# Patient Record
Sex: Female | Born: 1935 | Race: White | Hispanic: No | Marital: Single | Smoking: Never smoker
Health system: Southern US, Community
[De-identification: ages and names within clinical notes are randomized; demographics above are authoritative.]

## PROBLEM LIST (undated history)

## (undated) DIAGNOSIS — C4491 Basal cell carcinoma of skin, unspecified: Secondary | ICD-10-CM

## (undated) DIAGNOSIS — C50919 Malignant neoplasm of unspecified site of unspecified female breast: Secondary | ICD-10-CM

## (undated) DIAGNOSIS — C801 Malignant (primary) neoplasm, unspecified: Secondary | ICD-10-CM

## (undated) DIAGNOSIS — I2510 Atherosclerotic heart disease of native coronary artery without angina pectoris: Secondary | ICD-10-CM

## (undated) DIAGNOSIS — K219 Gastro-esophageal reflux disease without esophagitis: Secondary | ICD-10-CM

## (undated) DIAGNOSIS — I10 Essential (primary) hypertension: Secondary | ICD-10-CM

## (undated) DIAGNOSIS — L570 Actinic keratosis: Secondary | ICD-10-CM

## (undated) DIAGNOSIS — Z923 Personal history of irradiation: Secondary | ICD-10-CM

## (undated) HISTORY — DX: Actinic keratosis: L57.0

## (undated) HISTORY — PX: ABDOMINAL HYSTERECTOMY: SHX81

## (undated) HISTORY — PX: OTHER SURGICAL HISTORY: SHX169

## (undated) HISTORY — DX: Basal cell carcinoma of skin, unspecified: C44.91

---

## 2003-01-13 ENCOUNTER — Other Ambulatory Visit: Payer: Self-pay

## 2004-06-27 ENCOUNTER — Ambulatory Visit: Payer: Self-pay | Admitting: Family Medicine

## 2005-08-23 ENCOUNTER — Ambulatory Visit: Payer: Self-pay | Admitting: Family Medicine

## 2006-08-23 ENCOUNTER — Ambulatory Visit: Payer: Self-pay | Admitting: Unknown Physician Specialty

## 2006-09-24 ENCOUNTER — Ambulatory Visit: Payer: Self-pay | Admitting: Family Medicine

## 2007-07-29 ENCOUNTER — Ambulatory Visit: Payer: Self-pay

## 2007-11-11 ENCOUNTER — Ambulatory Visit: Payer: Self-pay | Admitting: Family Medicine

## 2008-12-27 ENCOUNTER — Ambulatory Visit: Payer: Self-pay | Admitting: Family Medicine

## 2010-01-01 DIAGNOSIS — Z923 Personal history of irradiation: Secondary | ICD-10-CM

## 2010-01-01 HISTORY — PX: BREAST LUMPECTOMY: SHX2

## 2010-01-01 HISTORY — DX: Personal history of irradiation: Z92.3

## 2010-01-10 ENCOUNTER — Ambulatory Visit: Payer: Self-pay | Admitting: Family Medicine

## 2010-01-17 ENCOUNTER — Ambulatory Visit: Payer: Self-pay | Admitting: Family Medicine

## 2010-02-03 ENCOUNTER — Ambulatory Visit: Payer: Self-pay | Admitting: Surgery

## 2010-02-10 ENCOUNTER — Ambulatory Visit: Payer: Self-pay | Admitting: Surgery

## 2010-02-13 LAB — PATHOLOGY REPORT

## 2010-02-16 ENCOUNTER — Ambulatory Visit: Payer: Self-pay | Admitting: Internal Medicine

## 2010-02-22 ENCOUNTER — Ambulatory Visit: Payer: Self-pay | Admitting: Internal Medicine

## 2010-03-02 ENCOUNTER — Ambulatory Visit: Payer: Self-pay | Admitting: Internal Medicine

## 2010-03-09 ENCOUNTER — Ambulatory Visit: Payer: Self-pay | Admitting: Surgery

## 2010-04-02 ENCOUNTER — Ambulatory Visit: Payer: Self-pay | Admitting: Internal Medicine

## 2010-05-02 ENCOUNTER — Ambulatory Visit: Payer: Self-pay | Admitting: Internal Medicine

## 2010-06-02 ENCOUNTER — Ambulatory Visit: Payer: Self-pay | Admitting: Internal Medicine

## 2010-12-22 ENCOUNTER — Ambulatory Visit: Payer: Self-pay | Admitting: Internal Medicine

## 2011-01-17 ENCOUNTER — Ambulatory Visit: Payer: Self-pay | Admitting: Internal Medicine

## 2011-02-02 ENCOUNTER — Ambulatory Visit: Payer: Self-pay | Admitting: Internal Medicine

## 2011-02-08 ENCOUNTER — Ambulatory Visit: Payer: Self-pay | Admitting: Family Medicine

## 2011-08-09 ENCOUNTER — Ambulatory Visit: Payer: Self-pay | Admitting: Family Medicine

## 2011-08-29 ENCOUNTER — Ambulatory Visit: Payer: Self-pay | Admitting: Unknown Physician Specialty

## 2011-08-31 LAB — PATHOLOGY REPORT

## 2012-08-12 ENCOUNTER — Ambulatory Visit: Payer: Self-pay | Admitting: Family Medicine

## 2013-01-16 ENCOUNTER — Ambulatory Visit: Payer: Self-pay | Admitting: Radiation Oncology

## 2013-02-01 ENCOUNTER — Ambulatory Visit: Payer: Self-pay | Admitting: Radiation Oncology

## 2013-08-14 ENCOUNTER — Ambulatory Visit: Payer: Self-pay | Admitting: Family Medicine

## 2014-01-01 HISTORY — PX: BREAST BIOPSY: SHX20

## 2014-01-07 DIAGNOSIS — I10 Essential (primary) hypertension: Secondary | ICD-10-CM | POA: Diagnosis not present

## 2014-01-07 DIAGNOSIS — E785 Hyperlipidemia, unspecified: Secondary | ICD-10-CM | POA: Diagnosis not present

## 2014-01-15 ENCOUNTER — Ambulatory Visit: Payer: Self-pay | Admitting: Radiation Oncology

## 2014-04-13 DIAGNOSIS — L853 Xerosis cutis: Secondary | ICD-10-CM | POA: Diagnosis not present

## 2014-04-13 DIAGNOSIS — L821 Other seborrheic keratosis: Secondary | ICD-10-CM | POA: Diagnosis not present

## 2014-04-13 DIAGNOSIS — L578 Other skin changes due to chronic exposure to nonionizing radiation: Secondary | ICD-10-CM | POA: Diagnosis not present

## 2014-04-13 DIAGNOSIS — L570 Actinic keratosis: Secondary | ICD-10-CM | POA: Diagnosis not present

## 2014-04-13 DIAGNOSIS — Z85828 Personal history of other malignant neoplasm of skin: Secondary | ICD-10-CM | POA: Diagnosis not present

## 2014-06-29 DIAGNOSIS — L570 Actinic keratosis: Secondary | ICD-10-CM | POA: Diagnosis not present

## 2014-06-29 DIAGNOSIS — L578 Other skin changes due to chronic exposure to nonionizing radiation: Secondary | ICD-10-CM | POA: Diagnosis not present

## 2014-06-29 DIAGNOSIS — L219 Seborrheic dermatitis, unspecified: Secondary | ICD-10-CM | POA: Diagnosis not present

## 2014-07-29 DIAGNOSIS — S86011A Strain of right Achilles tendon, initial encounter: Secondary | ICD-10-CM | POA: Diagnosis not present

## 2014-08-02 ENCOUNTER — Other Ambulatory Visit: Payer: Self-pay | Admitting: Family Medicine

## 2014-08-02 DIAGNOSIS — Z1231 Encounter for screening mammogram for malignant neoplasm of breast: Secondary | ICD-10-CM

## 2014-08-02 DIAGNOSIS — C50911 Malignant neoplasm of unspecified site of right female breast: Secondary | ICD-10-CM

## 2014-08-16 ENCOUNTER — Ambulatory Visit
Admission: RE | Admit: 2014-08-16 | Discharge: 2014-08-16 | Disposition: A | Discharge status: Home / Self Care | Payer: Commercial Managed Care - HMO | Source: Ambulatory Visit | Attending: Family Medicine | Admitting: Family Medicine

## 2014-08-16 ENCOUNTER — Ambulatory Visit: Payer: Self-pay

## 2014-08-16 DIAGNOSIS — Z853 Personal history of malignant neoplasm of breast: Secondary | ICD-10-CM | POA: Diagnosis not present

## 2014-08-16 DIAGNOSIS — Z1231 Encounter for screening mammogram for malignant neoplasm of breast: Secondary | ICD-10-CM

## 2014-08-16 DIAGNOSIS — R921 Mammographic calcification found on diagnostic imaging of breast: Secondary | ICD-10-CM | POA: Insufficient documentation

## 2014-08-16 DIAGNOSIS — C50911 Malignant neoplasm of unspecified site of right female breast: Secondary | ICD-10-CM

## 2014-08-16 HISTORY — DX: Malignant (primary) neoplasm, unspecified: C80.1

## 2014-08-17 ENCOUNTER — Other Ambulatory Visit: Payer: Self-pay | Admitting: Family Medicine

## 2014-08-17 DIAGNOSIS — R921 Mammographic calcification found on diagnostic imaging of breast: Secondary | ICD-10-CM

## 2014-08-17 DIAGNOSIS — R928 Other abnormal and inconclusive findings on diagnostic imaging of breast: Secondary | ICD-10-CM

## 2014-08-25 ENCOUNTER — Ambulatory Visit
Admission: RE | Admit: 2014-08-25 | Discharge: 2014-08-25 | Disposition: A | Discharge status: Home / Self Care | Payer: Commercial Managed Care - HMO | Source: Ambulatory Visit | Attending: Family Medicine | Admitting: Family Medicine

## 2014-08-25 DIAGNOSIS — R920 Mammographic microcalcification found on diagnostic imaging of breast: Secondary | ICD-10-CM | POA: Diagnosis not present

## 2014-08-25 DIAGNOSIS — R928 Other abnormal and inconclusive findings on diagnostic imaging of breast: Secondary | ICD-10-CM

## 2014-08-25 DIAGNOSIS — R921 Mammographic calcification found on diagnostic imaging of breast: Secondary | ICD-10-CM | POA: Diagnosis not present

## 2014-08-25 DIAGNOSIS — N6011 Diffuse cystic mastopathy of right breast: Secondary | ICD-10-CM | POA: Diagnosis not present

## 2014-08-26 LAB — SURGICAL PATHOLOGY

## 2014-09-07 DIAGNOSIS — Z1211 Encounter for screening for malignant neoplasm of colon: Secondary | ICD-10-CM | POA: Diagnosis not present

## 2014-09-07 DIAGNOSIS — Z86010 Personal history of colonic polyps: Secondary | ICD-10-CM | POA: Diagnosis not present

## 2014-10-08 ENCOUNTER — Encounter: Payer: Self-pay | Admitting: *Deleted

## 2014-10-11 ENCOUNTER — Ambulatory Visit: Payer: Commercial Managed Care - HMO | Admitting: Anesthesiology

## 2014-10-11 ENCOUNTER — Encounter
Admission: RE | Disposition: A | Discharge status: Home / Self Care | Payer: Self-pay | Source: Ambulatory Visit | Attending: Unknown Physician Specialty

## 2014-10-11 ENCOUNTER — Encounter: Payer: Self-pay | Admitting: *Deleted

## 2014-10-11 ENCOUNTER — Ambulatory Visit
Admission: RE | Admit: 2014-10-11 | Discharge: 2014-10-11 | Disposition: A | Discharge status: Home / Self Care | Payer: Commercial Managed Care - HMO | Source: Ambulatory Visit | Attending: Unknown Physician Specialty | Admitting: Unknown Physician Specialty

## 2014-10-11 DIAGNOSIS — I10 Essential (primary) hypertension: Secondary | ICD-10-CM | POA: Diagnosis not present

## 2014-10-11 DIAGNOSIS — Z90710 Acquired absence of both cervix and uterus: Secondary | ICD-10-CM | POA: Insufficient documentation

## 2014-10-11 DIAGNOSIS — K621 Rectal polyp: Secondary | ICD-10-CM | POA: Insufficient documentation

## 2014-10-11 DIAGNOSIS — Z923 Personal history of irradiation: Secondary | ICD-10-CM | POA: Insufficient documentation

## 2014-10-11 DIAGNOSIS — Z853 Personal history of malignant neoplasm of breast: Secondary | ICD-10-CM | POA: Diagnosis not present

## 2014-10-11 DIAGNOSIS — K5790 Diverticulosis of intestine, part unspecified, without perforation or abscess without bleeding: Secondary | ICD-10-CM | POA: Diagnosis not present

## 2014-10-11 DIAGNOSIS — D120 Benign neoplasm of cecum: Secondary | ICD-10-CM | POA: Diagnosis not present

## 2014-10-11 DIAGNOSIS — D124 Benign neoplasm of descending colon: Secondary | ICD-10-CM | POA: Diagnosis not present

## 2014-10-11 DIAGNOSIS — K635 Polyp of colon: Secondary | ICD-10-CM | POA: Diagnosis not present

## 2014-10-11 DIAGNOSIS — Z79899 Other long term (current) drug therapy: Secondary | ICD-10-CM | POA: Diagnosis not present

## 2014-10-11 DIAGNOSIS — D128 Benign neoplasm of rectum: Secondary | ICD-10-CM | POA: Diagnosis not present

## 2014-10-11 DIAGNOSIS — K5730 Diverticulosis of large intestine without perforation or abscess without bleeding: Secondary | ICD-10-CM | POA: Diagnosis not present

## 2014-10-11 DIAGNOSIS — Z85828 Personal history of other malignant neoplasm of skin: Secondary | ICD-10-CM | POA: Insufficient documentation

## 2014-10-11 DIAGNOSIS — Z86010 Personal history of colonic polyps: Secondary | ICD-10-CM | POA: Insufficient documentation

## 2014-10-11 DIAGNOSIS — D122 Benign neoplasm of ascending colon: Secondary | ICD-10-CM | POA: Insufficient documentation

## 2014-10-11 DIAGNOSIS — Z803 Family history of malignant neoplasm of breast: Secondary | ICD-10-CM | POA: Diagnosis not present

## 2014-10-11 DIAGNOSIS — K219 Gastro-esophageal reflux disease without esophagitis: Secondary | ICD-10-CM | POA: Diagnosis not present

## 2014-10-11 DIAGNOSIS — I2510 Atherosclerotic heart disease of native coronary artery without angina pectoris: Secondary | ICD-10-CM | POA: Diagnosis not present

## 2014-10-11 HISTORY — DX: Atherosclerotic heart disease of native coronary artery without angina pectoris: I25.10

## 2014-10-11 HISTORY — PX: COLONOSCOPY WITH PROPOFOL: SHX5780

## 2014-10-11 HISTORY — DX: Gastro-esophageal reflux disease without esophagitis: K21.9

## 2014-10-11 HISTORY — DX: Essential (primary) hypertension: I10

## 2014-10-11 SURGERY — COLONOSCOPY WITH PROPOFOL
Anesthesia: General

## 2014-10-11 MED ORDER — EPHEDRINE SULFATE 50 MG/ML IJ SOLN
INTRAMUSCULAR | Status: DC | PRN
  Administered 2014-10-11: 10 mg via INTRAVENOUS
  Administered 2014-10-11: 5 mg via INTRAVENOUS
  Administered 2014-10-11: 10 mg via INTRAVENOUS

## 2014-10-11 MED ORDER — FENTANYL CITRATE (PF) 100 MCG/2ML IJ SOLN
INTRAMUSCULAR | Status: DC | PRN
  Administered 2014-10-11: 50 ug via INTRAVENOUS

## 2014-10-11 MED ORDER — PROPOFOL 10 MG/ML IV BOLUS
INTRAVENOUS | Status: DC | PRN
  Administered 2014-10-11: 20 mg via INTRAVENOUS
  Administered 2014-10-11: 30 mg via INTRAVENOUS

## 2014-10-11 MED ORDER — SODIUM CHLORIDE 0.9 % IV SOLN
INTRAVENOUS | Status: DC
  Administered 2014-10-11: 10:00:00 via INTRAVENOUS

## 2014-10-11 MED ORDER — PHENYLEPHRINE HCL 10 MG/ML IJ SOLN
INTRAMUSCULAR | Status: DC | PRN
  Administered 2014-10-11: 200 ug via INTRAVENOUS
  Administered 2014-10-11: 100 ug via INTRAVENOUS

## 2014-10-11 MED ORDER — PROPOFOL 500 MG/50ML IV EMUL
INTRAVENOUS | Status: DC | PRN
  Administered 2014-10-11: 75 ug/kg/min via INTRAVENOUS

## 2014-10-11 MED ORDER — SODIUM CHLORIDE 0.9 % IV SOLN: INTRAVENOUS | Status: DC

## 2014-10-11 MED ORDER — LIDOCAINE HCL (PF) 2 % IJ SOLN
INTRAMUSCULAR | Status: DC | PRN
  Administered 2014-10-11: 50 mg

## 2014-10-11 NOTE — Transfer of Care (Signed)
Immediate Anesthesia Transfer of Care Note  Patient: Robin Roberts  Procedure(s) Performed: Procedure(s): COLONOSCOPY WITH PROPOFOL (N/A)  Patient Location: PACU  Anesthesia Type:General  Level of Consciousness: sedated  Airway & Oxygen Therapy: Patient Spontanous Breathing and Patient connected to nasal cannula oxygen  Post-op Assessment: Report given to RN and Post -op Vital signs reviewed and stable  Post vital signs: Reviewed and stable  Last Vitals:  Filed Vitals:   10/11/14 1040  BP: 69/41  Pulse: 88  Temp: 35.8 C  Resp: 17    Complications: No apparent anesthesia complications

## 2014-10-11 NOTE — Anesthesia Preprocedure Evaluation (Signed)
Anesthesia Evaluation  Patient identified by MRN, date of birth, ID band Patient awake    Reviewed: Allergy & Precautions, H&P , NPO status , Patient's Chart, lab work & pertinent test results  History of Anesthesia Complications Negative for: history of anesthetic complications  Airway Mallampati: II  TM Distance: >3 FB Neck ROM: limited    Dental  (+) Teeth Intact   Pulmonary neg pulmonary ROS, neg shortness of breath,    Pulmonary exam normal breath sounds clear to auscultation       Cardiovascular Exercise Tolerance: Good hypertension, (-) angina+ CAD  (-) Past MI Normal cardiovascular exam Rhythm:regular Rate:Normal     Neuro/Psych negative neurological ROS  negative psych ROS   GI/Hepatic Neg liver ROS, GERD  Controlled,  Endo/Other  negative endocrine ROS  Renal/GU negative Renal ROS  negative genitourinary   Musculoskeletal   Abdominal   Peds  Hematology negative hematology ROS (+)   Anesthesia Other Findings Past Medical History:   Cancer ([place])                                                   Comment:skin   Cancer ([place])                                    right          Comment:breast cancer 2012   GERD (gastroesophageal reflux disease)                       Coronary artery disease                                      Hypertension                                                BMI    Body Mass Index   26.95 kg/m 2      Reproductive/Obstetrics negative OB ROS                             Anesthesia Physical Anesthesia Plan  ASA: III  Anesthesia Plan: General   Post-op Pain Management:    Induction:   Airway Management Planned:   Additional Equipment:   Intra-op Plan:   Post-operative Plan:   Informed Consent: I have reviewed the patients History and Physical, chart, labs and discussed the procedure including the risks, benefits and alternatives for the  proposed anesthesia with the patient or authorized representative who has indicated his/her understanding and acceptance.   Dental Advisory Given  Plan Discussed with: Anesthesiologist, CRNA and Surgeon  Anesthesia Plan Comments:         Anesthesia Quick Evaluation

## 2014-10-11 NOTE — Anesthesia Postprocedure Evaluation (Signed)
  Anesthesia Post-op Note  Patient: Robin Roberts  Procedure(s) Performed: Procedure(s): COLONOSCOPY WITH PROPOFOL (N/A)  Anesthesia type:General  Patient location: PACU  Post pain: Pain level controlled  Post assessment: Post-op Vital signs reviewed, Patient's Cardiovascular Status Stable, Respiratory Function Stable, Patent Airway and No signs of Nausea or vomiting  Post vital signs: Reviewed and stable  Last Vitals:  Filed Vitals:   10/11/14 1100  BP: 108/53  Pulse: 82  Temp:   Resp: 12    Level of consciousness: awake, alert  and patient cooperative  Complications: No apparent anesthesia complications

## 2014-10-11 NOTE — H&P (Signed)
   Primary Care Physician:  Maryland Pink, MD Primary Gastroenterologist:  Dr. Vira Agar  Pre-Procedure History & Physical: HPI:  Robin Roberts is a 81 y.o. female is here for an colonoscopy.   Past Medical History  Diagnosis Date  . Cancer ([place])     skin  . Cancer [facility]) right    breast cancer 2012  . GERD (gastroesophageal reflux disease)   . Coronary artery disease   . Hypertension     Past Surgical History  Procedure Laterality Date  . Breast excisional biopsy Right     2012 positive  . Abdominal hysterectomy    . Post status radiation therapy Right     breast cancer  . Mastectomy      Prior to Admission medications   Medication Sig Start Date End Date Taking? Authorizing Provider  amlodipine-atorvastatin (CADUET) 10-20 MG tablet Take 1 tablet by mouth daily.   Yes Historical Provider, MD  benazepril (LOTENSIN) 20 MG tablet Take 20 mg by mouth daily.   Yes Historical Provider, MD  fluticasone (FLONASE) 50 MCG/ACT nasal spray Place 1 spray into both nostrils daily.   Yes Historical Provider, MD  loratadine (CLARITIN) 10 MG tablet Take 10 mg by mouth daily as needed for allergies.   Yes Historical Provider, MD  ranitidine (ZANTAC) 150 MG tablet Take 150 mg by mouth 2 (two) times daily.   Yes Historical Provider, MD  triamterene-hydrochlorothiazide (MAXZIDE-25) 37.5-25 MG tablet Take 1 tablet by mouth daily.   Yes Historical Provider, MD  amLODipine-benazepril (LOTREL) 10-20 MG capsule Take 1 capsule by mouth daily.    Historical Provider, MD  hydrochlorothiazide (MICROZIDE) 12.5 MG capsule Take 12.5 mg by mouth daily.    Historical Provider, MD    Allergies as of 09/08/2014  . (Not on File)    Family History  Problem Relation Age of Onset  . Breast cancer Neg Hx     Social History   Social History  . Marital Status: Single    Spouse Name: N/A  . Number of Children: N/A  . Years of Education: N/A   Occupational History  . Not on file.   Social History Main  Topics  . Smoking status: Never Smoker   . Smokeless tobacco: Not on file  . Alcohol Use: No  . Drug Use: No  . Sexual Activity: Not on file   Other Topics Concern  . Not on file   Social History Narrative    Review of Systems: See HPI, otherwise negative ROS  Physical Exam: BP 132/66 mmHg  Pulse 105  Temp(Src) 97.7 F (36.5 C) (Tympanic)  Resp 12  Ht 5\' 5"  (1.651 m)  Wt 73.483 kg (162 lb)  BMI 26.96 kg/m2  SpO2 98% General:   Alert,  pleasant and cooperative in NAD Head:  Normocephalic and atraumatic. Neck:  Supple; no masses or thyromegaly. Lungs:  Clear throughout to auscultation.    Heart:  Regular rate and rhythm. Abdomen:  Soft, nontender and nondistended. Normal bowel sounds, without guarding, and without rebound.   Neurologic:  Alert and  oriented x4;  grossly normal neurologically.  Impression/Plan: Robin Roberts is here for an colonoscopy to be performed for [facility] colon polyps  Risks, benefits, limitations, and alternatives regarding  colonoscopy have been reviewed with the patient.  Questions have been answered.  All parties agreeable.   Gaylyn Cheers, MD  10/11/2014, 10:07 AM

## 2014-10-11 NOTE — Op Note (Signed)
[facility] Gastroenterology Patient Name: Robin Roberts Procedure Date: 10/11/2014 10:07 AM MRN: [id number] Account #: [id number] Date of Birth: Aug 09, 1933 Admit Type: Outpatient Age: 81 Room: [facility] ENDO ROOM 1 Gender: Female Note Status: Finalized Procedure:         Colonoscopy Indications:       Personal history of colonic polyps Providers:         Manya Silvas, MD Referring MD:      Irven Easterly. Kary Kos, MD (Referring MD) Medicines:         Propofol per Anesthesia Complications:     No immediate complications. Procedure:         Pre-Anesthesia Assessment:                    - After reviewing the risks and benefits, the patient was                     deemed in satisfactory condition to undergo the procedure.                    After obtaining informed consent, the colonoscope was                     passed under direct vision. Throughout the procedure, the                     patient's blood pressure, pulse, and oxygen saturations                     were monitored continuously. The Colonoscope was                     introduced through the anus and advanced to the the cecum,                     identified by appendiceal orifice and ileocecal valve. Findings:      Multiple small-mouthed diverticula were found in the sigmoid colon, in       the descending colon and in the transverse colon.      Three sessile polyps were found in the rectum. The polyps were       diminutive in size. These polyps were removed with a cold biopsy       forceps. Resection and retrieval were complete.      A diminutive polyp was found in the ascending colon. The polyp was       sessile. The polyp was removed with a cold biopsy forceps. Resection and       retrieval were complete.      A diminutive polyp was found in the cecum. The polyp was sessile. The       polyp was removed with a cold biopsy forceps. Resection and retrieval       were complete.      The exam was otherwise  without abnormality. Impression:        - Diverticulosis in the sigmoid colon, in the descending                     colon and in the transverse colon.                    - Three diminutive polyps in the rectum. Resected and  retrieved.                    - One diminutive polyp in the ascending colon. Resected                     and retrieved.                    - One diminutive polyp in the cecum. Resected and                     retrieved.                    - The examination was otherwise normal. Recommendation:    - Await pathology results. Manya Silvas, MD 10/11/2014 10:32:33 AM This report has been signed electronically. Number of Addenda: 0 Note Initiated On: 10/11/2014 10:07 AM Scope Withdrawal Time: 0 hours 10 minutes 24 seconds  Total Procedure Duration: 0 hours 15 minutes 19 seconds       [facility]

## 2014-10-13 LAB — SURGICAL PATHOLOGY

## 2014-10-19 DIAGNOSIS — L578 Other skin changes due to chronic exposure to nonionizing radiation: Secondary | ICD-10-CM | POA: Diagnosis not present

## 2014-10-19 DIAGNOSIS — Z85828 Personal history of other malignant neoplasm of skin: Secondary | ICD-10-CM | POA: Diagnosis not present

## 2014-10-19 DIAGNOSIS — L570 Actinic keratosis: Secondary | ICD-10-CM | POA: Diagnosis not present

## 2014-10-20 ENCOUNTER — Encounter: Payer: Self-pay | Admitting: Unknown Physician Specialty

## 2015-01-05 DIAGNOSIS — L570 Actinic keratosis: Secondary | ICD-10-CM | POA: Diagnosis not present

## 2015-01-13 DIAGNOSIS — Z0000 Encounter for general adult medical examination without abnormal findings: Secondary | ICD-10-CM | POA: Diagnosis not present

## 2015-01-13 DIAGNOSIS — Z79899 Other long term (current) drug therapy: Secondary | ICD-10-CM | POA: Diagnosis not present

## 2015-01-20 DIAGNOSIS — I10 Essential (primary) hypertension: Secondary | ICD-10-CM | POA: Diagnosis not present

## 2015-01-20 DIAGNOSIS — Z0000 Encounter for general adult medical examination without abnormal findings: Secondary | ICD-10-CM | POA: Diagnosis not present

## 2015-01-20 DIAGNOSIS — E785 Hyperlipidemia, unspecified: Secondary | ICD-10-CM | POA: Diagnosis not present

## 2015-01-20 DIAGNOSIS — N289 Disorder of kidney and ureter, unspecified: Secondary | ICD-10-CM | POA: Diagnosis not present

## 2015-02-07 DIAGNOSIS — L570 Actinic keratosis: Secondary | ICD-10-CM | POA: Diagnosis not present

## 2015-02-15 DIAGNOSIS — D485 Neoplasm of uncertain behavior of skin: Secondary | ICD-10-CM | POA: Diagnosis not present

## 2015-02-15 DIAGNOSIS — L570 Actinic keratosis: Secondary | ICD-10-CM | POA: Diagnosis not present

## 2015-02-15 DIAGNOSIS — L820 Inflamed seborrheic keratosis: Secondary | ICD-10-CM | POA: Diagnosis not present

## 2015-02-15 DIAGNOSIS — L578 Other skin changes due to chronic exposure to nonionizing radiation: Secondary | ICD-10-CM | POA: Diagnosis not present

## 2015-02-16 DIAGNOSIS — N289 Disorder of kidney and ureter, unspecified: Secondary | ICD-10-CM | POA: Diagnosis not present

## 2015-03-01 DIAGNOSIS — H35361 Drusen (degenerative) of macula, right eye: Secondary | ICD-10-CM | POA: Diagnosis not present

## 2015-04-19 DIAGNOSIS — L853 Xerosis cutis: Secondary | ICD-10-CM | POA: Diagnosis not present

## 2015-04-19 DIAGNOSIS — L570 Actinic keratosis: Secondary | ICD-10-CM | POA: Diagnosis not present

## 2015-04-19 DIAGNOSIS — L578 Other skin changes due to chronic exposure to nonionizing radiation: Secondary | ICD-10-CM | POA: Diagnosis not present

## 2015-07-14 DIAGNOSIS — S80262A Insect bite (nonvenomous), left knee, initial encounter: Secondary | ICD-10-CM | POA: Diagnosis not present

## 2015-07-14 DIAGNOSIS — R229 Localized swelling, mass and lump, unspecified: Secondary | ICD-10-CM | POA: Diagnosis not present

## 2015-07-14 DIAGNOSIS — W57XXXA Bitten or stung by nonvenomous insect and other nonvenomous arthropods, initial encounter: Secondary | ICD-10-CM | POA: Diagnosis not present

## 2015-08-18 DIAGNOSIS — Z79899 Other long term (current) drug therapy: Secondary | ICD-10-CM | POA: Diagnosis not present

## 2015-08-19 ENCOUNTER — Other Ambulatory Visit: Payer: Self-pay | Admitting: Family Medicine

## 2015-08-19 DIAGNOSIS — Z853 Personal history of malignant neoplasm of breast: Secondary | ICD-10-CM

## 2015-09-19 DIAGNOSIS — R7989 Other specified abnormal findings of blood chemistry: Secondary | ICD-10-CM | POA: Diagnosis not present

## 2015-09-22 ENCOUNTER — Ambulatory Visit
Admission: RE | Admit: 2015-09-22 | Discharge: 2015-09-22 | Disposition: A | Discharge status: Home / Self Care | Payer: Commercial Managed Care - HMO | Source: Ambulatory Visit | Attending: Family Medicine | Admitting: Family Medicine

## 2015-09-22 DIAGNOSIS — Z9889 Other specified postprocedural states: Secondary | ICD-10-CM | POA: Diagnosis not present

## 2015-09-22 DIAGNOSIS — Z853 Personal history of malignant neoplasm of breast: Secondary | ICD-10-CM | POA: Diagnosis not present

## 2015-09-22 DIAGNOSIS — R928 Other abnormal and inconclusive findings on diagnostic imaging of breast: Secondary | ICD-10-CM | POA: Diagnosis not present

## 2015-09-22 DIAGNOSIS — R921 Mammographic calcification found on diagnostic imaging of breast: Secondary | ICD-10-CM | POA: Diagnosis not present

## 2015-12-12 DIAGNOSIS — L570 Actinic keratosis: Secondary | ICD-10-CM | POA: Diagnosis not present

## 2015-12-12 DIAGNOSIS — L820 Inflamed seborrheic keratosis: Secondary | ICD-10-CM | POA: Diagnosis not present

## 2015-12-12 DIAGNOSIS — L578 Other skin changes due to chronic exposure to nonionizing radiation: Secondary | ICD-10-CM | POA: Diagnosis not present

## 2015-12-12 DIAGNOSIS — Z85828 Personal history of other malignant neoplasm of skin: Secondary | ICD-10-CM | POA: Diagnosis not present

## 2016-01-16 DIAGNOSIS — I10 Essential (primary) hypertension: Secondary | ICD-10-CM | POA: Diagnosis not present

## 2016-01-16 DIAGNOSIS — Z0000 Encounter for general adult medical examination without abnormal findings: Secondary | ICD-10-CM | POA: Diagnosis not present

## 2016-01-23 DIAGNOSIS — Z0000 Encounter for general adult medical examination without abnormal findings: Secondary | ICD-10-CM | POA: Diagnosis not present

## 2016-01-23 DIAGNOSIS — I10 Essential (primary) hypertension: Secondary | ICD-10-CM | POA: Diagnosis not present

## 2016-01-23 DIAGNOSIS — Z23 Encounter for immunization: Secondary | ICD-10-CM | POA: Diagnosis not present

## 2016-02-27 DIAGNOSIS — Z85828 Personal history of other malignant neoplasm of skin: Secondary | ICD-10-CM | POA: Diagnosis not present

## 2016-02-27 DIAGNOSIS — L578 Other skin changes due to chronic exposure to nonionizing radiation: Secondary | ICD-10-CM | POA: Diagnosis not present

## 2016-02-27 DIAGNOSIS — L821 Other seborrheic keratosis: Secondary | ICD-10-CM | POA: Diagnosis not present

## 2016-02-27 DIAGNOSIS — L570 Actinic keratosis: Secondary | ICD-10-CM | POA: Diagnosis not present

## 2016-02-27 DIAGNOSIS — D692 Other nonthrombocytopenic purpura: Secondary | ICD-10-CM | POA: Diagnosis not present

## 2016-04-17 DIAGNOSIS — D3132 Benign neoplasm of left choroid: Secondary | ICD-10-CM | POA: Diagnosis not present

## 2016-05-30 DIAGNOSIS — M25562 Pain in left knee: Secondary | ICD-10-CM | POA: Diagnosis not present

## 2016-05-30 DIAGNOSIS — M170 Bilateral primary osteoarthritis of knee: Secondary | ICD-10-CM | POA: Diagnosis not present

## 2016-05-30 DIAGNOSIS — M25561 Pain in right knee: Secondary | ICD-10-CM | POA: Diagnosis not present

## 2016-08-27 DIAGNOSIS — Z85828 Personal history of other malignant neoplasm of skin: Secondary | ICD-10-CM | POA: Diagnosis not present

## 2016-08-27 DIAGNOSIS — L570 Actinic keratosis: Secondary | ICD-10-CM | POA: Diagnosis not present

## 2016-08-27 DIAGNOSIS — L578 Other skin changes due to chronic exposure to nonionizing radiation: Secondary | ICD-10-CM | POA: Diagnosis not present

## 2016-09-21 ENCOUNTER — Other Ambulatory Visit: Payer: Self-pay | Admitting: Family Medicine

## 2016-09-21 DIAGNOSIS — Z853 Personal history of malignant neoplasm of breast: Secondary | ICD-10-CM

## 2016-10-04 ENCOUNTER — Ambulatory Visit
Admission: RE | Admit: 2016-10-04 | Discharge: 2016-10-04 | Disposition: A | Discharge status: Home / Self Care | Payer: Commercial Managed Care - HMO | Source: Ambulatory Visit | Attending: Family Medicine | Admitting: Family Medicine

## 2016-10-04 DIAGNOSIS — Z853 Personal history of malignant neoplasm of breast: Secondary | ICD-10-CM | POA: Diagnosis not present

## 2016-10-04 DIAGNOSIS — R922 Inconclusive mammogram: Secondary | ICD-10-CM | POA: Diagnosis not present

## 2016-10-04 HISTORY — DX: Personal history of irradiation: Z92.3

## 2016-12-19 DIAGNOSIS — H6123 Impacted cerumen, bilateral: Secondary | ICD-10-CM | POA: Diagnosis not present

## 2016-12-19 DIAGNOSIS — J301 Allergic rhinitis due to pollen: Secondary | ICD-10-CM | POA: Diagnosis not present

## 2017-01-15 DIAGNOSIS — Z0000 Encounter for general adult medical examination without abnormal findings: Secondary | ICD-10-CM | POA: Diagnosis not present

## 2017-01-23 DIAGNOSIS — Z23 Encounter for immunization: Secondary | ICD-10-CM | POA: Diagnosis not present

## 2017-01-23 DIAGNOSIS — Z0000 Encounter for general adult medical examination without abnormal findings: Secondary | ICD-10-CM | POA: Diagnosis not present

## 2017-01-23 DIAGNOSIS — N183 Chronic kidney disease, stage 3 (moderate): Secondary | ICD-10-CM | POA: Diagnosis not present

## 2017-01-23 DIAGNOSIS — M25562 Pain in left knee: Secondary | ICD-10-CM | POA: Diagnosis not present

## 2017-01-23 DIAGNOSIS — E785 Hyperlipidemia, unspecified: Secondary | ICD-10-CM | POA: Diagnosis not present

## 2017-01-23 DIAGNOSIS — M25561 Pain in right knee: Secondary | ICD-10-CM | POA: Diagnosis not present

## 2017-01-23 DIAGNOSIS — I10 Essential (primary) hypertension: Secondary | ICD-10-CM | POA: Diagnosis not present

## 2017-02-03 ENCOUNTER — Other Ambulatory Visit: Payer: Self-pay

## 2017-02-03 ENCOUNTER — Emergency Department
Admission: EM | Admit: 2017-02-03 | Discharge: 2017-02-03 | Disposition: A | Discharge status: Home / Self Care | Payer: PRIVATE HEALTH INSURANCE | Attending: Emergency Medicine | Admitting: Emergency Medicine

## 2017-02-03 ENCOUNTER — Encounter: Payer: Self-pay | Admitting: Emergency Medicine

## 2017-02-03 DIAGNOSIS — Y92017 Garden or yard in single-family (private) house as the place of occurrence of the external cause: Secondary | ICD-10-CM | POA: Diagnosis not present

## 2017-02-03 DIAGNOSIS — S0592XA Unspecified injury of left eye and orbit, initial encounter: Secondary | ICD-10-CM | POA: Diagnosis present

## 2017-02-03 DIAGNOSIS — I10 Essential (primary) hypertension: Secondary | ICD-10-CM | POA: Insufficient documentation

## 2017-02-03 DIAGNOSIS — Y999 Unspecified external cause status: Secondary | ICD-10-CM | POA: Insufficient documentation

## 2017-02-03 DIAGNOSIS — Z79899 Other long term (current) drug therapy: Secondary | ICD-10-CM | POA: Insufficient documentation

## 2017-02-03 DIAGNOSIS — S0502XA Injury of conjunctiva and corneal abrasion without foreign body, left eye, initial encounter: Secondary | ICD-10-CM

## 2017-02-03 DIAGNOSIS — W228XXA Striking against or struck by other objects, initial encounter: Secondary | ICD-10-CM | POA: Diagnosis not present

## 2017-02-03 DIAGNOSIS — Y93H2 Activity, gardening and landscaping: Secondary | ICD-10-CM | POA: Diagnosis not present

## 2017-02-03 MED ORDER — KETOROLAC TROMETHAMINE 0.5 % OP SOLN: 1.0000 [drp] | Freq: Four times a day (QID) | OPHTHALMIC | 0 refills | Status: DC

## 2017-02-03 MED ORDER — POLYMYXIN B-TRIMETHOPRIM 10000-0.1 UNIT/ML-% OP SOLN: 2.0000 [drp] | Freq: Four times a day (QID) | OPHTHALMIC | 0 refills | Status: DC

## 2017-02-03 MED ORDER — FLUORESCEIN SODIUM 1 MG OP STRP
1.0000 | ORAL_STRIP | Freq: Once | OPHTHALMIC | Status: DC
  Filled 2017-02-03: qty 1

## 2017-02-03 MED ORDER — TETRACAINE HCL 0.5 % OP SOLN
2.0000 [drp] | Freq: Once | OPHTHALMIC | Status: DC
  Filled 2017-02-03: qty 4

## 2017-02-03 NOTE — ED Provider Notes (Signed)
[facility] Emergency Department Provider Note  ____________________________________________  Time seen: Approximately 4:18 PM  I have reviewed the triage vital signs and the nursing notes.   HISTORY  Chief Complaint Eye Pain    HPI Robin Roberts is a 84 y.o. female who presents the emergency department complaining of left eye pain, clear drainage status post "scratching" with a branch at home.  Patient was trimming a tree in her yard when she accidentally "put myself with 1 of the small twigs."  Patient reports that she had no symptoms immediately but over the intervening period she has developed a foreign body sensation, redness, clear tear production.  Patient denies any visual changes.  She does wear glasses but does not wear contacts.  She has not used any medications for this complaint.  No medications  Past Medical History:  Diagnosis Date  . Cancer ([place])    skin  . Cancer [facility]) right   breast cancer 2012  . Coronary artery disease   . GERD (gastroesophageal reflux disease)   . Hypertension   . Personal history of radiation therapy     There are no active problems to display for this patient.   Past Surgical History:  Procedure Laterality Date  . ABDOMINAL HYSTERECTOMY    . BREAST BIOPSY Right 2016   stereo neg  . BREAST EXCISIONAL BIOPSY Right    2012 positive  . BREAST LUMPECTOMY Right 2012  . COLONOSCOPY WITH PROPOFOL N/A 10/11/2014   Procedure: COLONOSCOPY WITH PROPOFOL;  Surgeon: Manya Silvas, MD;  Location: [facility] ENDOSCOPY;  Service: Endoscopy;  Laterality: N/A;  . post status radiation therapy Right    breast cancer    Prior to Admission medications   Medication Sig Start Date End Date Taking? Authorizing Provider  amlodipine-atorvastatin (CADUET) 10-20 MG tablet Take 1 tablet by mouth daily.    [provider]  amLODipine-benazepril (LOTREL) 10-20 MG capsule Take 1 capsule by mouth daily.    [provider]   benazepril (LOTENSIN) 20 MG tablet Take 20 mg by mouth daily.    [provider]  fluticasone (FLONASE) 50 MCG/ACT nasal spray Place 1 spray into both nostrils daily.    [provider]  hydrochlorothiazide (MICROZIDE) 12.5 MG capsule Take 12.5 mg by mouth daily.    [provider]  ketorolac (ACULAR) 0.5 % ophthalmic solution Place 1 drop into the right eye 4 (four) times daily. 02/03/17   Jameka Ivie, Charline Bills, PA-C  loratadine (CLARITIN) 10 MG tablet Take 10 mg by mouth daily as needed for allergies.    [provider]  ranitidine (ZANTAC) 150 MG tablet Take 150 mg by mouth 2 (two) times daily.    [provider]  triamterene-hydrochlorothiazide (MAXZIDE-25) 37.5-25 MG tablet Take 1 tablet by mouth daily.    [provider]  trimethoprim-polymyxin b (POLYTRIM) ophthalmic solution Place 2 drops into the left eye every 6 (six) hours. 02/03/17   Jalynn Waddell, Charline Bills, PA-C    Allergies Penicillins  Family History  Problem Relation Age of Onset  . Breast cancer Daughter 80    Social History Social History   Tobacco Use  . Smoking status: Never Smoker  . Smokeless tobacco: Never Used  Substance Use Topics  . Alcohol use: No  . Drug use: No     Review of Systems  Constitutional: No fever/chills Eyes: No visual changes. No discharge.  Positive for injury and ENT: No upper respiratory complaints. Cardiovascular: no chest pain. Respiratory: no cough.  No SOB. Gastrointestinal: No abdominal pain.  No nausea, no vomiting.  No diarrhea.  No constipation. Musculoskeletal: Negative for musculoskeletal pain. Skin: Negative for rash, abrasions, lacerations, ecchymosis. Neurological: Negative for headaches, focal weakness or numbness. 10-point ROS otherwise negative.  ____________________________________________   PHYSICAL EXAM:  VITAL SIGNS: ED Triage Vitals [02/03/17 1449]  Enc Vitals Group     BP (!) 144/88     Pulse Rate 99      Resp 18     Temp 98.1 F (36.7 C)     Temp src      SpO2 95 %     Weight 156 lb (70.8 kg)     Height 5\' 5"  (1.651 m)     Head Circumference      Peak Flow      Pain Score 7     Pain Loc      Pain Edu?      Excl. in [place]?      Constitutional: Alert and oriented. Well appearing and in no acute distress. Eyes: Conjunctiva is injected on the left side. PERRL. EOMI. Funduscopic exam reveals no foreign body.  Red reflex bilaterally.  Vasculature and optic disc is unremarkable bilaterally..  Eyes anesthetized using tetracaine drops.  Forcing staining applied with area of uptake in the center over the iris.  No visible foreign body.  No other areas of uptake. Head: Atraumatic. Neck: No stridor.    Cardiovascular: Normal rate, regular rhythm. Normal S1 and S2.  Good peripheral circulation. Respiratory: Normal respiratory effort without tachypnea or retractions. Lungs CTAB. Good air entry to the bases with no decreased or absent breath sounds. Musculoskeletal: Full range of motion to all extremities. No gross deformities appreciated. Neurologic:  Normal speech and language. No gross focal neurologic deficits are appreciated.  Skin:  Skin is warm, dry and intact. No rash noted. Psychiatric: Mood and affect are normal. Speech and behavior are normal. Patient exhibits appropriate insight and judgement.   ____________________________________________   LABS (all labs ordered are listed, but only abnormal results are displayed)  Labs Reviewed - No data to display ____________________________________________  EKG   ____________________________________________  RADIOLOGY   No results found.  ____________________________________________    PROCEDURES  Procedure(s) performed:    Procedures    Medications  fluorescein ophthalmic strip 1 strip (not administered)  tetracaine (PONTOCAINE) 0.5 % ophthalmic solution 2 drop (not administered)      ____________________________________________   INITIAL IMPRESSION / ASSESSMENT AND PLAN / ED COURSE  Pertinent labs & imaging results that were available during my care of the patient were reviewed by me and considered in my medical decision making (see chart for details).  Review of the [place] CSRS was performed in accordance of the [place] prior to dispensing any controlled drugs.     Patient's diagnosis is consistent with corneal abrasion.  Patient presented 2 days after injury to the left eye.  She is complaining of redness, increased tear production.  Exam was mostly reassuring.  Very small area of uptake consistent with corneal abrasion identified on exam.  Patient will be covered with antibiotic eyedrops, Acular drops for symptom control.  Patient will follow primary care or ophthalmology as needed..  Patient is given ED precautions to return to the ED for any worsening or new symptoms.     ____________________________________________  FINAL CLINICAL IMPRESSION(S) / ED DIAGNOSES  Final diagnoses:  Abrasion of left cornea, initial encounter      NEW MEDICATIONS STARTED DURING THIS VISIT:  ED  Discharge Orders        Ordered    ketorolac (ACULAR) 0.5 % ophthalmic solution  4 times daily     02/03/17 1648    trimethoprim-polymyxin b (POLYTRIM) ophthalmic solution  Every 6 hours     02/03/17 1648          This chart was dictated using voice recognition software/Dragon. Despite best efforts to proofread, errors can occur which can change the meaning. Any change was purely unintentional.    Darletta Moll, PA-C 02/03/17 1657    Clearnce Hasten Randall An, MD 02/03/17 [id number]

## 2017-02-03 NOTE — ED Triage Notes (Signed)
Pt states was clearing a crepe myrtle tree on Friday when she scratched her L eye. Pt presents with redness and clear drainage from L eye at this time.

## 2017-02-04 DIAGNOSIS — H209 Unspecified iridocyclitis: Secondary | ICD-10-CM | POA: Diagnosis not present

## 2017-02-07 DIAGNOSIS — H209 Unspecified iridocyclitis: Secondary | ICD-10-CM | POA: Diagnosis not present

## 2017-02-13 DIAGNOSIS — H209 Unspecified iridocyclitis: Secondary | ICD-10-CM | POA: Diagnosis not present

## 2017-03-05 DIAGNOSIS — L853 Xerosis cutis: Secondary | ICD-10-CM | POA: Diagnosis not present

## 2017-03-05 DIAGNOSIS — L578 Other skin changes due to chronic exposure to nonionizing radiation: Secondary | ICD-10-CM | POA: Diagnosis not present

## 2017-03-05 DIAGNOSIS — L570 Actinic keratosis: Secondary | ICD-10-CM | POA: Diagnosis not present

## 2017-03-05 DIAGNOSIS — Z85828 Personal history of other malignant neoplasm of skin: Secondary | ICD-10-CM | POA: Diagnosis not present

## 2017-05-13 DIAGNOSIS — M1711 Unilateral primary osteoarthritis, right knee: Secondary | ICD-10-CM | POA: Diagnosis not present

## 2017-05-29 DIAGNOSIS — D3132 Benign neoplasm of left choroid: Secondary | ICD-10-CM | POA: Diagnosis not present

## 2017-08-30 ENCOUNTER — Other Ambulatory Visit: Payer: Self-pay | Admitting: Family Medicine

## 2017-08-30 DIAGNOSIS — Z1231 Encounter for screening mammogram for malignant neoplasm of breast: Secondary | ICD-10-CM

## 2017-10-09 ENCOUNTER — Ambulatory Visit
Admission: RE | Admit: 2017-10-09 | Discharge: 2017-10-09 | Disposition: A | Discharge status: Home / Self Care | Payer: PRIVATE HEALTH INSURANCE | Source: Ambulatory Visit | Attending: Family Medicine | Admitting: Family Medicine

## 2017-10-09 DIAGNOSIS — Z1231 Encounter for screening mammogram for malignant neoplasm of breast: Secondary | ICD-10-CM | POA: Insufficient documentation

## 2017-10-15 DIAGNOSIS — C4492 Squamous cell carcinoma of skin, unspecified: Secondary | ICD-10-CM

## 2017-10-15 DIAGNOSIS — L821 Other seborrheic keratosis: Secondary | ICD-10-CM | POA: Diagnosis not present

## 2017-10-15 DIAGNOSIS — L570 Actinic keratosis: Secondary | ICD-10-CM | POA: Diagnosis not present

## 2017-10-15 DIAGNOSIS — C44722 Squamous cell carcinoma of skin of right lower limb, including hip: Secondary | ICD-10-CM | POA: Diagnosis not present

## 2017-10-15 DIAGNOSIS — D485 Neoplasm of uncertain behavior of skin: Secondary | ICD-10-CM | POA: Diagnosis not present

## 2017-10-15 DIAGNOSIS — Z85828 Personal history of other malignant neoplasm of skin: Secondary | ICD-10-CM | POA: Diagnosis not present

## 2017-10-15 DIAGNOSIS — L820 Inflamed seborrheic keratosis: Secondary | ICD-10-CM | POA: Diagnosis not present

## 2017-10-15 HISTORY — DX: Squamous cell carcinoma of skin, unspecified: C44.92

## 2017-11-05 DIAGNOSIS — C44722 Squamous cell carcinoma of skin of right lower limb, including hip: Secondary | ICD-10-CM | POA: Diagnosis not present

## 2017-12-05 DIAGNOSIS — C44722 Squamous cell carcinoma of skin of right lower limb, including hip: Secondary | ICD-10-CM | POA: Diagnosis not present

## 2018-01-08 DIAGNOSIS — C44722 Squamous cell carcinoma of skin of right lower limb, including hip: Secondary | ICD-10-CM | POA: Diagnosis not present

## 2018-01-23 DIAGNOSIS — E785 Hyperlipidemia, unspecified: Secondary | ICD-10-CM | POA: Diagnosis not present

## 2018-01-23 DIAGNOSIS — I10 Essential (primary) hypertension: Secondary | ICD-10-CM | POA: Diagnosis not present

## 2018-01-27 DIAGNOSIS — E785 Hyperlipidemia, unspecified: Secondary | ICD-10-CM | POA: Diagnosis not present

## 2018-01-27 DIAGNOSIS — K219 Gastro-esophageal reflux disease without esophagitis: Secondary | ICD-10-CM | POA: Diagnosis not present

## 2018-01-27 DIAGNOSIS — R8290 Unspecified abnormal findings in urine: Secondary | ICD-10-CM | POA: Diagnosis not present

## 2018-01-27 DIAGNOSIS — I10 Essential (primary) hypertension: Secondary | ICD-10-CM | POA: Diagnosis not present

## 2018-01-27 DIAGNOSIS — Z0000 Encounter for general adult medical examination without abnormal findings: Secondary | ICD-10-CM | POA: Diagnosis not present

## 2018-01-27 DIAGNOSIS — Z23 Encounter for immunization: Secondary | ICD-10-CM | POA: Diagnosis not present

## 2018-02-11 DIAGNOSIS — C44722 Squamous cell carcinoma of skin of right lower limb, including hip: Secondary | ICD-10-CM | POA: Diagnosis not present

## 2018-04-22 DIAGNOSIS — L570 Actinic keratosis: Secondary | ICD-10-CM | POA: Diagnosis not present

## 2018-04-22 DIAGNOSIS — R234 Changes in skin texture: Secondary | ICD-10-CM | POA: Diagnosis not present

## 2018-04-22 DIAGNOSIS — L910 Hypertrophic scar: Secondary | ICD-10-CM | POA: Diagnosis not present

## 2018-04-22 DIAGNOSIS — Z85828 Personal history of other malignant neoplasm of skin: Secondary | ICD-10-CM | POA: Diagnosis not present

## 2018-09-02 DIAGNOSIS — M25561 Pain in right knee: Secondary | ICD-10-CM | POA: Diagnosis not present

## 2018-09-02 DIAGNOSIS — W010XXA Fall on same level from slipping, tripping and stumbling without subsequent striking against object, initial encounter: Secondary | ICD-10-CM | POA: Diagnosis not present

## 2018-09-02 DIAGNOSIS — M546 Pain in thoracic spine: Secondary | ICD-10-CM | POA: Diagnosis not present

## 2018-09-02 DIAGNOSIS — M1711 Unilateral primary osteoarthritis, right knee: Secondary | ICD-10-CM | POA: Diagnosis not present

## 2018-10-28 DIAGNOSIS — L570 Actinic keratosis: Secondary | ICD-10-CM | POA: Diagnosis not present

## 2018-10-28 DIAGNOSIS — Z85828 Personal history of other malignant neoplasm of skin: Secondary | ICD-10-CM | POA: Diagnosis not present

## 2018-10-28 DIAGNOSIS — R238 Other skin changes: Secondary | ICD-10-CM | POA: Diagnosis not present

## 2018-10-28 DIAGNOSIS — L821 Other seborrheic keratosis: Secondary | ICD-10-CM | POA: Diagnosis not present

## 2018-11-17 ENCOUNTER — Other Ambulatory Visit: Payer: Self-pay | Admitting: Family Medicine

## 2018-11-17 DIAGNOSIS — Z1231 Encounter for screening mammogram for malignant neoplasm of breast: Secondary | ICD-10-CM

## 2019-01-06 DIAGNOSIS — H6123 Impacted cerumen, bilateral: Secondary | ICD-10-CM | POA: Diagnosis not present

## 2019-01-06 DIAGNOSIS — J301 Allergic rhinitis due to pollen: Secondary | ICD-10-CM | POA: Diagnosis not present

## 2019-01-06 DIAGNOSIS — R42 Dizziness and giddiness: Secondary | ICD-10-CM | POA: Diagnosis not present

## 2019-01-23 DIAGNOSIS — E785 Hyperlipidemia, unspecified: Secondary | ICD-10-CM | POA: Diagnosis not present

## 2019-01-23 DIAGNOSIS — I10 Essential (primary) hypertension: Secondary | ICD-10-CM | POA: Diagnosis not present

## 2019-01-29 DIAGNOSIS — I10 Essential (primary) hypertension: Secondary | ICD-10-CM | POA: Diagnosis not present

## 2019-01-29 DIAGNOSIS — Z0000 Encounter for general adult medical examination without abnormal findings: Secondary | ICD-10-CM | POA: Diagnosis not present

## 2019-01-29 DIAGNOSIS — R0602 Shortness of breath: Secondary | ICD-10-CM | POA: Diagnosis not present

## 2019-01-29 DIAGNOSIS — R0789 Other chest pain: Secondary | ICD-10-CM | POA: Diagnosis not present

## 2019-01-29 DIAGNOSIS — E785 Hyperlipidemia, unspecified: Secondary | ICD-10-CM | POA: Diagnosis not present

## 2019-02-11 DIAGNOSIS — E785 Hyperlipidemia, unspecified: Secondary | ICD-10-CM | POA: Diagnosis not present

## 2019-02-11 DIAGNOSIS — I10 Essential (primary) hypertension: Secondary | ICD-10-CM | POA: Diagnosis not present

## 2019-02-11 DIAGNOSIS — R0789 Other chest pain: Secondary | ICD-10-CM | POA: Diagnosis not present

## 2019-02-18 DIAGNOSIS — R0789 Other chest pain: Secondary | ICD-10-CM | POA: Diagnosis not present

## 2019-02-26 ENCOUNTER — Ambulatory Visit: Payer: Medicare HMO | Attending: Internal Medicine

## 2019-02-26 DIAGNOSIS — Z23 Encounter for immunization: Secondary | ICD-10-CM | POA: Insufficient documentation

## 2019-02-26 NOTE — Progress Notes (Signed)
   Covid-19 Vaccination Clinic  Name:  Robin Roberts    MRN: [id number] DOB: [birth year]  02/26/2019  [name] was observed post Covid-19 immunization for 15 minutes without incidence. She was provided with Vaccine Information Sheet and instruction to access the V-Safe system.   [name] was instructed to call 911 with any severe reactions post vaccine: Marland Kitchen Difficulty breathing  . Swelling of your face and throat  . A fast heartbeat  . A bad rash all over your body  . Dizziness and weakness    Immunizations Administered    Name Date Dose VIS Date Route   Pfizer COVID-19 Vaccine 02/26/2019 12:36 PM 0.3 mL 12/12/2018 Intramuscular   Manufacturer: [place]   Lot: [id number]   [place]: [id number]

## 2019-03-10 ENCOUNTER — Ambulatory Visit
Admission: RE | Admit: 2019-03-10 | Discharge: 2019-03-10 | Disposition: A | Discharge status: Home / Self Care | Payer: Medicare HMO | Source: Ambulatory Visit | Attending: Family Medicine | Admitting: Family Medicine

## 2019-03-10 DIAGNOSIS — Z1231 Encounter for screening mammogram for malignant neoplasm of breast: Secondary | ICD-10-CM | POA: Insufficient documentation

## 2019-03-10 HISTORY — DX: Malignant neoplasm of unspecified site of unspecified female breast: C50.919

## 2019-03-25 ENCOUNTER — Ambulatory Visit: Payer: Medicare HMO | Attending: Internal Medicine

## 2019-03-25 DIAGNOSIS — Z23 Encounter for immunization: Secondary | ICD-10-CM

## 2019-03-25 NOTE — Progress Notes (Signed)
   Covid-19 Vaccination Clinic  Name:  LEVERNE MALZONE    MRN: [id number] DOB: 25-Feb-1933  03/25/2019  Ms. Stlaurent was observed post Covid-19 immunization for 15 minutes without incident. She was provided with Vaccine Information Sheet and instruction to access the V-Safe system.   Ms. Sforza was instructed to call 911 with any severe reactions post vaccine: Marland Kitchen Difficulty breathing  . Swelling of face and throat  . A fast heartbeat  . A bad rash all over body  . Dizziness and weakness   Immunizations Administered    Name Date Dose VIS Date Route   Pfizer COVID-19 Vaccine 03/25/2019 11:14 AM 0.3 mL 12/12/2018 Intramuscular   Manufacturer: [employer]   Lot: [id number]   [place]: [id number]

## 2019-04-06 DIAGNOSIS — Z961 Presence of intraocular lens: Secondary | ICD-10-CM | POA: Diagnosis not present

## 2019-04-06 DIAGNOSIS — D3132 Benign neoplasm of left choroid: Secondary | ICD-10-CM | POA: Diagnosis not present

## 2019-04-06 DIAGNOSIS — Z0100 Encounter for examination of eyes and vision without abnormal findings: Secondary | ICD-10-CM | POA: Diagnosis not present

## 2019-04-14 ENCOUNTER — Ambulatory Visit: Payer: Medicare HMO | Admitting: Dermatology

## 2019-04-14 ENCOUNTER — Other Ambulatory Visit: Payer: Self-pay

## 2019-04-14 DIAGNOSIS — L570 Actinic keratosis: Secondary | ICD-10-CM

## 2019-04-14 DIAGNOSIS — L578 Other skin changes due to chronic exposure to nonionizing radiation: Secondary | ICD-10-CM | POA: Diagnosis not present

## 2019-04-14 DIAGNOSIS — D2239 Melanocytic nevi of other parts of face: Secondary | ICD-10-CM | POA: Diagnosis not present

## 2019-04-14 DIAGNOSIS — Z85828 Personal history of other malignant neoplasm of skin: Secondary | ICD-10-CM

## 2019-04-14 DIAGNOSIS — D220 Melanocytic nevi of lip: Secondary | ICD-10-CM | POA: Diagnosis not present

## 2019-04-14 DIAGNOSIS — D22111 Melanocytic nevi of right upper eyelid, including canthus: Secondary | ICD-10-CM

## 2019-04-14 DIAGNOSIS — D229 Melanocytic nevi, unspecified: Secondary | ICD-10-CM

## 2019-04-14 NOTE — Patient Instructions (Signed)
Cryotherapy Aftercare  . Wash gently with soap and water everyday.   . Apply Vaseline and Band-Aid daily until healed. Recommend daily broad spectrum sunscreen SPF 30+ to sun-exposed areas, reapply every 2 hours as needed. Call for new or changing lesions.  

## 2019-04-14 NOTE — Progress Notes (Signed)
   Follow-Up Visit   Subjective  Robin Roberts is a 86 y.o. female who presents for the following: Follow-up (AK follow up).  She has h/o SCC/BCC.  The following portions of the chart were reviewed this encounter and updated as appropriate:     Review of Systems: No other skin or systemic complaints.  Objective  Well appearing patient in no apparent distress; mood and affect are within normal limits.  A focused examination was performed including face, arms, hands. Relevant physical exam findings are noted in the Assessment and Plan.  Objective  Left nasal tip x 2, glabella x 2, R hand dorsum x 1, L thumb x 2 (7): Erythematous thin papules/macules with gritty scale.   Objective  Right Plantar Foot: Well healed scar with no evidence of recurrence.  There is a nearby hyperkeratotic patch c/w callous.  Objective  Right upper nasolabial, R upper medial eyelid: 32mm flesh papule R upper nasolabial and R upper medial eyelid Both vrs SK  R upper lip: 64mm firm flesh white papule at R upper lip Vrs cyst  Assessment & Plan  AK (actinic keratosis) (7) Left nasal tip x 2, glabella x 2, R hand dorsum x 1, L thumb x 2  Destruction of lesion - Left nasal tip x 2, glabella x 2, R hand dorsum x 1, L thumb x 2 Complexity: simple   Destruction method: cryotherapy   Informed consent: discussed and consent obtained   Lesion destroyed using liquid nitrogen: Yes   Region frozen until ice ball extended beyond lesion: Yes   Outcome: patient tolerated procedure well with no complications   Post-procedure details: wound care instructions given    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 Mediplast pads to callous   Nevus (2) Right upper nasolabial, R upper medial eyelid; R upper lip  Benign-appearing.  Observation.  Call clinic for new or changing moles.  Recommend daily use of broad spectrum  spf 30+ sunscreen to sun-exposed areas.   Actinic Damage - diffuse scaly erythematous macules with underlying dyspigmentation - Recommend daily broad spectrum sunscreen SPF 30+ to sun-exposed areas, reapply every 2 hours as needed.  - Call for new or changing lesions.  Return in about 6 months (around 10/14/2019) for AK follow up.   Graciella Belton, RMA, am acting as scribe for Brendolyn Patty, MD .

## 2019-08-12 DIAGNOSIS — M5489 Other dorsalgia: Secondary | ICD-10-CM | POA: Diagnosis not present

## 2019-08-12 DIAGNOSIS — M5136 Other intervertebral disc degeneration, lumbar region: Secondary | ICD-10-CM | POA: Diagnosis not present

## 2019-08-12 DIAGNOSIS — M461 Sacroiliitis, not elsewhere classified: Secondary | ICD-10-CM | POA: Diagnosis not present

## 2019-08-12 DIAGNOSIS — M5442 Lumbago with sciatica, left side: Secondary | ICD-10-CM | POA: Diagnosis not present

## 2019-08-12 DIAGNOSIS — M47816 Spondylosis without myelopathy or radiculopathy, lumbar region: Secondary | ICD-10-CM | POA: Diagnosis not present

## 2019-09-23 DIAGNOSIS — R1032 Left lower quadrant pain: Secondary | ICD-10-CM | POA: Diagnosis not present

## 2019-09-23 DIAGNOSIS — Z86010 Personal history of colonic polyps: Secondary | ICD-10-CM | POA: Diagnosis not present

## 2019-09-23 DIAGNOSIS — K5900 Constipation, unspecified: Secondary | ICD-10-CM | POA: Diagnosis not present

## 2019-09-23 DIAGNOSIS — Z8719 Personal history of other diseases of the digestive system: Secondary | ICD-10-CM | POA: Diagnosis not present

## 2019-10-05 DIAGNOSIS — Z79899 Other long term (current) drug therapy: Secondary | ICD-10-CM | POA: Diagnosis not present

## 2019-10-06 ENCOUNTER — Other Ambulatory Visit: Payer: Self-pay

## 2019-10-06 ENCOUNTER — Ambulatory Visit: Payer: Medicare HMO | Admitting: Dermatology

## 2019-10-06 DIAGNOSIS — L570 Actinic keratosis: Secondary | ICD-10-CM | POA: Diagnosis not present

## 2019-10-06 DIAGNOSIS — Z85828 Personal history of other malignant neoplasm of skin: Secondary | ICD-10-CM

## 2019-10-06 DIAGNOSIS — L578 Other skin changes due to chronic exposure to nonionizing radiation: Secondary | ICD-10-CM

## 2019-10-06 NOTE — Progress Notes (Signed)
   Follow-Up Visit   Subjective  Robin Roberts is a 86 y.o. female who presents for the following: Follow-up.  Patient here today for 6 month AK follow up. Ak's treated at last visit at left nasal tip, glabella, right hand dorsum and left thumb. She has h/o SCC/BCC. Those spots have cleared up.  The following portions of the chart were reviewed this encounter and updated as appropriate:      Review of Systems:  No other skin or systemic complaints except as noted in HPI or Assessment and Plan.  Objective  Well appearing patient in no apparent distress; mood and affect are within normal limits.  A focused examination was performed including face, hands, right foot. Relevant physical exam findings are noted in the Assessment and Plan.  Objective  R ant ankle x 1, R nasal dorsum x 2, L nasal tip x 1, L malar cheek x 1, R 1st MCP x 1 (6): Erythematous thin papules/macules with gritty scale, hypertrophic at right ant ankle   Assessment & Plan  AK (actinic keratosis) (6) R ant ankle x 1, R nasal dorsum x 2, L nasal tip x 1, L malar cheek x 1, R 1st MCP x 1  Cryotherapy today  Destruction of lesion - R ant ankle x 1, R nasal dorsum x 2, L nasal tip x 1, L malar cheek x 1, R 1st MCP x 1  Destruction method: cryotherapy   Destruction method comment:  Electrodessication Informed consent: discussed and consent obtained   Lesion destroyed using liquid nitrogen: Yes   Region frozen until ice ball extended beyond lesion: Yes   Outcome: patient tolerated procedure well with no complications   Post-procedure details: wound care instructions given   Additional details:  Prior to procedure, discussed risks of blister formation, small wound, skin dyspigmentation, or rare scar following cryotherapy.   History of Squamous Cell Carcinoma of the Skin - No evidence of recurrence today at right plantar foot - Recommend regular full body skin exams - Recommend daily broad spectrum sunscreen SPF  30+ to sun-exposed areas, reapply every 2 hours as needed.  - Call if any new or changing lesions are noted between office visits  Actinic Damage - diffuse scaly erythematous macules with underlying dyspigmentation - Recommend daily broad spectrum sunscreen SPF 30+ to sun-exposed areas, reapply every 2 hours as needed.  - Call for new or changing lesions.   Return in about 6 months (around 04/05/2020) for AK follow up.  Graciella Belton, RMA, am acting as scribe for Brendolyn Patty, MD . Documentation: I have reviewed the above documentation for accuracy and completeness, and I agree with the above.  Brendolyn Patty MD

## 2019-10-06 NOTE — Patient Instructions (Signed)
Cryotherapy Aftercare  . Wash gently with soap and water everyday.   . Apply Vaseline and Band-Aid daily until healed.  

## 2019-10-20 DIAGNOSIS — R7989 Other specified abnormal findings of blood chemistry: Secondary | ICD-10-CM | POA: Diagnosis not present

## 2020-01-26 DIAGNOSIS — E785 Hyperlipidemia, unspecified: Secondary | ICD-10-CM | POA: Diagnosis not present

## 2020-01-26 DIAGNOSIS — I10 Essential (primary) hypertension: Secondary | ICD-10-CM | POA: Diagnosis not present

## 2020-02-01 DIAGNOSIS — I10 Essential (primary) hypertension: Secondary | ICD-10-CM | POA: Diagnosis not present

## 2020-02-01 DIAGNOSIS — K219 Gastro-esophageal reflux disease without esophagitis: Secondary | ICD-10-CM | POA: Diagnosis not present

## 2020-02-01 DIAGNOSIS — R630 Anorexia: Secondary | ICD-10-CM | POA: Diagnosis not present

## 2020-02-01 DIAGNOSIS — M461 Sacroiliitis, not elsewhere classified: Secondary | ICD-10-CM | POA: Diagnosis not present

## 2020-02-01 DIAGNOSIS — R634 Abnormal weight loss: Secondary | ICD-10-CM | POA: Diagnosis not present

## 2020-02-01 DIAGNOSIS — Z0000 Encounter for general adult medical examination without abnormal findings: Secondary | ICD-10-CM | POA: Diagnosis not present

## 2020-02-01 DIAGNOSIS — N1832 Chronic kidney disease, stage 3b: Secondary | ICD-10-CM | POA: Diagnosis not present

## 2020-02-01 DIAGNOSIS — G4700 Insomnia, unspecified: Secondary | ICD-10-CM | POA: Diagnosis not present

## 2020-02-01 DIAGNOSIS — I129 Hypertensive chronic kidney disease with stage 1 through stage 4 chronic kidney disease, or unspecified chronic kidney disease: Secondary | ICD-10-CM | POA: Diagnosis not present

## 2020-02-01 DIAGNOSIS — E785 Hyperlipidemia, unspecified: Secondary | ICD-10-CM | POA: Diagnosis not present

## 2020-02-04 DIAGNOSIS — H6123 Impacted cerumen, bilateral: Secondary | ICD-10-CM | POA: Diagnosis not present

## 2020-02-04 DIAGNOSIS — J301 Allergic rhinitis due to pollen: Secondary | ICD-10-CM | POA: Diagnosis not present

## 2020-04-05 ENCOUNTER — Other Ambulatory Visit: Payer: Self-pay

## 2020-04-05 ENCOUNTER — Ambulatory Visit: Payer: Medicare HMO | Admitting: Dermatology

## 2020-04-05 DIAGNOSIS — L821 Other seborrheic keratosis: Secondary | ICD-10-CM | POA: Diagnosis not present

## 2020-04-05 DIAGNOSIS — L814 Other melanin hyperpigmentation: Secondary | ICD-10-CM

## 2020-04-05 DIAGNOSIS — L570 Actinic keratosis: Secondary | ICD-10-CM

## 2020-04-05 DIAGNOSIS — L578 Other skin changes due to chronic exposure to nonionizing radiation: Secondary | ICD-10-CM

## 2020-04-05 NOTE — Progress Notes (Signed)
Follow-Up Visit   Subjective  Robin Roberts is a 87 y.o. female who presents for the following: 6 month follow up (Patient here today for ak follow up. Patient has history of aks on various parts of body. She denies any new concerns today. ).   The following portions of the chart were reviewed this encounter and updated as appropriate:       Objective  Well appearing patient in no apparent distress; mood and affect are within normal limits.  A focused examination was performed including face, chest, bilateral hands, bilateral forearms. Relevant physical exam findings are noted in the Assessment and Plan.  Objective  left zygoma x 2, left medial cheek x 3, left nasal ala x 1,right index finger x 2, nasal tip x 3, right zygoma x 1, right upper lip x 1 , right forearm x 1, left base of 3rd finger x 1, left index finger x 1, left hand dorsum x 1 (17): Erythematous thin papules/macules with gritty scale.   Objective  right sternum: 2 cm waxy brown patch with irregular pigment   Images    Assessment & Plan  Actinic keratosis (17) left zygoma x 2, left medial cheek x 3, left nasal ala x 1,right index finger x 2, nasal tip x 3, right zygoma x 1, right upper lip x 1 , right forearm x 1, left base of 3rd finger x 1, left index finger x 1, left hand dorsum x 1  Prior to procedure, discussed risks of blister formation, small wound, skin dyspigmentation, or rare scar following cryotherapy. '   Destruction of lesion - left zygoma x 2, left medial cheek x 3, left nasal ala x 1,right index finger x 2, nasal tip x 3, right zygoma x 1, right upper lip x 1 , right forearm x 1, left base of 3rd finger x 1, left index finger x 1, left hand dorsum x 1  Destruction method: cryotherapy   Informed consent: discussed and consent obtained   Lesion destroyed using liquid nitrogen: Yes   Region frozen until ice ball extended beyond lesion: Yes   Outcome: patient tolerated procedure well with no  complications   Post-procedure details: wound care instructions given    Seborrheic keratosis right sternum  Benign-appearing.  Observation.  Call clinic for new or changing lesions.  Recommend daily use of broad spectrum spf 30+ sunscreen to sun-exposed areas.   Recheck on f/up   Lentigines - Scattered tan macules - Due to sun exposure - Benign-appering, observe - Recommend daily broad spectrum sunscreen SPF 30+ to sun-exposed areas, reapply every 2 hours as needed. - Call for any changes  Seborrheic Keratoses - Stuck-on, waxy, tan-brown papules and/or plaques  - Benign-appearing - Discussed benign etiology and prognosis. - Observe - Call for any changes  Actinic Damage - chronic, secondary to cumulative UV radiation exposure/sun exposure over time - diffuse scaly erythematous macules with underlying dyspigmentation - Recommend daily broad spectrum sunscreen SPF 30+ to sun-exposed areas, reapply every 2 hours as needed.  - Recommend staying in the shade or wearing long sleeves, sun glasses (UVA+UVB protection) and wide brim hats (4-inch brim around the entire circumference of the hat). - Call for new or changing lesions.  Return in about 6 months (around 10/05/2020) for ak followup.  I, Ruthell Rummage, CMA, am acting as scribe for Brendolyn Patty, MD.  Documentation: I have reviewed the above documentation for accuracy and completeness, and I agree with the above.  Brendolyn Patty  MD

## 2020-04-05 NOTE — Patient Instructions (Signed)
Recommend daily broad spectrum sunscreen SPF 30+ to sun-exposed areas, reapply every 2 hours as needed. Call for new or changing lesions.  Staying in the shade or wearing long sleeves, sun glasses (UVA+UVB protection) and wide brim hats (4-inch brim around the entire circumference of the hat) are also recommended for sun protection.   Cryotherapy Aftercare  . Wash gently with soap and water everyday.   Marland Kitchen Apply Vaseline and Band-Aid daily until healed.

## 2020-05-04 ENCOUNTER — Other Ambulatory Visit: Payer: Self-pay | Admitting: Family Medicine

## 2020-05-04 DIAGNOSIS — Z1231 Encounter for screening mammogram for malignant neoplasm of breast: Secondary | ICD-10-CM

## 2020-05-11 ENCOUNTER — Other Ambulatory Visit: Payer: Self-pay

## 2020-05-11 ENCOUNTER — Ambulatory Visit
Admission: RE | Admit: 2020-05-11 | Discharge: 2020-05-11 | Disposition: A | Discharge status: Home / Self Care | Payer: Medicare HMO | Source: Ambulatory Visit | Attending: Family Medicine | Admitting: Family Medicine

## 2020-05-11 DIAGNOSIS — Z1231 Encounter for screening mammogram for malignant neoplasm of breast: Secondary | ICD-10-CM | POA: Diagnosis not present

## 2020-05-27 DIAGNOSIS — L298 Other pruritus: Secondary | ICD-10-CM | POA: Diagnosis not present

## 2020-05-27 DIAGNOSIS — N1832 Chronic kidney disease, stage 3b: Secondary | ICD-10-CM | POA: Diagnosis not present

## 2020-10-04 ENCOUNTER — Ambulatory Visit: Payer: Medicare HMO | Admitting: Dermatology

## 2020-11-02 ENCOUNTER — Ambulatory Visit: Payer: Medicare HMO | Admitting: Dermatology

## 2020-11-07 ENCOUNTER — Ambulatory Visit: Payer: Medicare HMO | Admitting: Dermatology

## 2020-11-07 ENCOUNTER — Other Ambulatory Visit: Payer: Self-pay

## 2020-11-07 DIAGNOSIS — Z85828 Personal history of other malignant neoplasm of skin: Secondary | ICD-10-CM

## 2020-11-07 DIAGNOSIS — L820 Inflamed seborrheic keratosis: Secondary | ICD-10-CM

## 2020-11-07 DIAGNOSIS — L84 Corns and callosities: Secondary | ICD-10-CM | POA: Diagnosis not present

## 2020-11-07 DIAGNOSIS — L814 Other melanin hyperpigmentation: Secondary | ICD-10-CM

## 2020-11-07 DIAGNOSIS — Z1283 Encounter for screening for malignant neoplasm of skin: Secondary | ICD-10-CM

## 2020-11-07 DIAGNOSIS — L821 Other seborrheic keratosis: Secondary | ICD-10-CM | POA: Diagnosis not present

## 2020-11-07 DIAGNOSIS — L570 Actinic keratosis: Secondary | ICD-10-CM

## 2020-11-07 DIAGNOSIS — L578 Other skin changes due to chronic exposure to nonionizing radiation: Secondary | ICD-10-CM | POA: Diagnosis not present

## 2020-11-07 NOTE — Progress Notes (Signed)
Follow-Up Visit   Subjective  Robin Roberts is a 87 y.o. female who presents for the following: Follow-up.  Patient here for 6 month follow-up Aks. She has had PDT treatment to the face several years ago. Also recheck SK of the right sternum, no changes noted per patient. She has a history of SCC/BCCs.  The following portions of the chart were reviewed this encounter and updated as appropriate:       Review of Systems:  No other skin or systemic complaints except as noted in HPI or Assessment and Plan.  Objective  Well appearing patient in no apparent distress; mood and affect are within normal limits.  A focused examination was performed including face, arms. Relevant physical exam findings are noted in the Assessment and Plan.  R plantar foot Hyperkeratotic plaque mid plantar foot, not at [facility] site   L nasal tip x 1, R malar cheek x 1, R lower cheek x 1, R temple x 1, L chin x 1, L malar cheek x 1, L hand dorsum x 2 (8) Pink scaly macules.   R sternum 2 cm waxy brown patch with irregular pigment  Photo compared from previous visit. Stable    Glabella 1cm waxy flesh papule R glabella; waxy flesh papule of the R upper medial eyelid    Assessment & Plan   Seborrheic Keratoses - Stuck-on, waxy, tan-brown papules and/or plaques  - Benign-appearing - Discussed benign etiology and prognosis. - Observe - Call for any changes  Lentigines - Scattered tan macules - Due to sun exposure - Benign-appering, observe - Recommend daily broad spectrum sunscreen SPF 30+ to sun-exposed areas, reapply every 2 hours as needed. - Call for any changes  History of Squamous Cell Carcinoma of the Skin, s/p Mohs - No evidence of recurrence today of the right plantar foot - Recommend regular full body skin exams - Recommend daily broad spectrum sunscreen SPF 30+ to sun-exposed areas, reapply every 2 hours as needed.  - Call if any new or changing lesions are noted between office  visits  Actinic Damage - Severe, confluent actinic changes with pre-cancerous actinic keratoses  - Severe, chronic, not at goal, secondary to cumulative UV radiation exposure over time - diffuse scaly erythematous macules and papules with underlying dyspigmentation - Discussed Prescription "Field Treatment" for Severe, Chronic Confluent Actinic Changes with Pre-Cancerous Actinic Keratoses Field treatment involves treatment of an entire area of skin that has confluent Actinic Changes (Sun/ Ultraviolet light damage) and PreCancerous Actinic Keratoses by method of PhotoDynamic Therapy (PDT) and/or prescription Topical Chemotherapy agents such as 5-fluorouracil, 5-fluorouracil/calcipotriene, and/or imiquimod.  The purpose is to decrease the number of clinically evident and subclinical PreCancerous lesions to prevent progression to development of skin cancer by chemically destroying early precancer changes that may or may not be visible.  It has been shown to reduce the risk of developing skin cancer in the treated area. As a result of treatment, redness, scaling, crusting, and open sores may occur during treatment course. One or more than one of these methods may be used and may have to be used several times to control, suppress and eliminate the PreCancerous changes. Discussed treatment course, expected reaction, and possible side effects. - Recommend daily broad spectrum sunscreen SPF 30+ to sun-exposed areas, reapply every 2 hours as needed.  - Staying in the shade or wearing long sleeves, sun glasses (UVA+UVB protection) and wide brim hats (4-inch brim around the entire circumference of the hat) are also recommended. -  Call for new or changing lesions. -Recommend PDT to face x 2, 1 mo apart, pt will schedule  Pre-ulcerative corn or callous R plantar foot  Benign, Observe Recommend Curad Mediplast patches to soften area, changing when needed. Continue Urea Cream daily. Recheck on f/up  AK (actinic  keratosis) (8) L nasal tip x 1, R malar cheek x 1, R lower cheek x 1, R temple x 1, L chin x 1, L malar cheek x 1, L hand dorsum x 2  Actinic keratoses are precancerous spots that appear secondary to cumulative UV radiation exposure/sun exposure over time. They are chronic with expected duration over 1 year. A portion of actinic keratoses will progress to squamous cell carcinoma of the skin. It is not possible to reliably predict which spots will progress to skin cancer and so treatment is recommended to prevent development of skin cancer.  Recommend daily broad spectrum sunscreen SPF 30+ to sun-exposed areas, reapply every 2 hours as needed.  Recommend staying in the shade or wearing long sleeves, sun glasses (UVA+UVB protection) and wide brim hats (4-inch brim around the entire circumference of the hat). Call for new or changing lesions.  Destruction of lesion - L nasal tip x 1, R malar cheek x 1, R lower cheek x 1, R temple x 1, L chin x 1, L malar cheek x 1, L hand dorsum x 2  Destruction method: cryotherapy   Informed consent: discussed and consent obtained   Lesion destroyed using liquid nitrogen: Yes   Region frozen until ice ball extended beyond lesion: Yes   Outcome: patient tolerated procedure well with no complications   Post-procedure details: wound care instructions given   Additional details:  Prior to procedure, discussed risks of blister formation, small wound, skin dyspigmentation, or rare scar following cryotherapy. Recommend Vaseline ointment to treated areas while healing.   Inflamed seborrheic keratosis R sternum   Will treat with Cryotherapy due to irregularity and recheck on follow-up.    Destruction of lesion - R sternum  Destruction method: cryotherapy   Informed consent: discussed and consent obtained   Lesion destroyed using liquid nitrogen: Yes   Region frozen until ice ball extended beyond lesion: Yes   Outcome: patient tolerated procedure well with no  complications   Post-procedure details: wound care instructions given   Additional details:  Prior to procedure, discussed risks of blister formation, small wound, skin dyspigmentation, or rare scar following cryotherapy. Recommend Vaseline ointment to treated areas while healing.   Seborrheic keratosis Glabella  Reassured benign age-related growth.  Recommend observation.  Discussed cryotherapy if spot(s) become irritated or inflamed.  Return for PDT face x 2, 1 month apart (pt will call insurance first), 6 mos Dr Chauncey Cruel.  I, Jamesetta Orleans, CMA, am acting as scribe for Brendolyn Patty, MD .  Documentation: I have reviewed the above documentation for accuracy and completeness, and I agree with the above.  Brendolyn Patty MD

## 2020-11-07 NOTE — Patient Instructions (Addendum)
Curad Mediplast - Cut to fit callous on the bottom of foot, changing as needed.    Cryotherapy Aftercare  Wash gently with soap and water everyday.   Apply Vaseline and Band-Aid daily until healed.   Actinic Damage - Severe, confluent actinic changes with pre-cancerous actinic keratoses  - Severe, chronic, not at goal, secondary to cumulative UV radiation exposure over time - Discussed Prescription "Field Treatment" for Severe, Chronic Confluent Actinic Changes with Pre-Cancerous Actinic Keratoses Field treatment involves treatment of an entire area of skin that has confluent Actinic Changes (Sun/ Ultraviolet light damage) and PreCancerous Actinic Keratoses by method of PhotoDynamic Therapy (PDT) and/or prescription Topical Chemotherapy agents such as 5-fluorouracil, 5-fluorouracil/calcipotriene, and/or imiquimod.  The purpose is to decrease the number of clinically evident and subclinical PreCancerous lesions to prevent progression to development of skin cancer by chemically destroying early precancer changes that may or may not be visible.  It has been shown to reduce the risk of developing skin cancer in the treated area. As a result of treatment, redness, scaling, crusting, and open sores may occur during treatment course. One or more than one of these methods may be used and may have to be used several times to control, suppress and eliminate the PreCancerous changes. Discussed treatment course, expected reaction, and possible side effects. - Recommend daily broad spectrum sunscreen SPF 30+ to sun-exposed areas, reapply every 2 hours as needed.  - Staying in the shade or wearing long sleeves, sun glasses (UVA+UVB protection) and wide brim hats (4-inch brim around the entire circumference of the hat) are also recommended. - Call for new or changing lesions.   If you have any questions or concerns for your doctor, please call our main line at [phone] and press option 4 to reach your doctor's  medical assistant. If no one answers, please leave a voicemail as directed and we will return your call as soon as possible. Messages left after 4 pm will be answered the following business day.   You may also send [place] a message via [place]. We typically respond to MyChart messages within 1-2 business days.  For prescription refills, please ask your pharmacy to contact our office. Our fax number is [phone].  If you have an urgent issue when the clinic is closed that cannot wait until the next business day, you can page your doctor at the number below.    Please note that while we do our best to be available for urgent issues outside of office hours, we are not available 24/7.   If you have an urgent issue and are unable to reach [place], you may choose to seek medical care at your doctor's office, retail clinic, urgent care center, or emergency room.  If you have a medical emergency, please immediately call 911 or go to the emergency department.  Pager Numbers  - Dr. Nehemiah Massed: [phone]  - Dr. Laurence Ferrari: [phone]  - Dr. Nicole Kindred: [phone]  In the event of inclement weather, please call our main line at [phone] for an update on the status of any delays or closures.  Dermatology Medication Tips: Please keep the boxes that topical medications come in in order to help keep track of the instructions about where and how to use these. Pharmacies typically print the medication instructions only on the boxes and not directly on the medication tubes.   If your medication is too expensive, please contact our office at [phone] option 4 or send [place] a message through [place].   We are  unable to tell what your co-pay for medications will be in advance as this is different depending on your insurance coverage. However, we may be able to find a substitute medication at lower cost or fill out paperwork to get insurance to cover a needed medication.   If a prior authorization is required to  get your medication covered by your insurance company, please allow [place] 1-2 business days to complete this process.  Drug prices often vary depending on where the prescription is filled and some pharmacies may offer cheaper prices.  The website www.goodrx.com contains coupons for medications through different pharmacies. The prices here do not account for what the cost may be with help from insurance (it may be cheaper with your insurance), but the website can give you the price if you did not use any insurance.  - You can print the associated coupon and take it with your prescription to the pharmacy.  - You may also stop by our office during regular business hours and pick up a GoodRx coupon card.  - If you need your prescription sent electronically to a different pharmacy, notify our office through [facility] or by phone at [phone] option 4.

## 2021-01-26 DIAGNOSIS — E785 Hyperlipidemia, unspecified: Secondary | ICD-10-CM | POA: Diagnosis not present

## 2021-01-26 DIAGNOSIS — I10 Essential (primary) hypertension: Secondary | ICD-10-CM | POA: Diagnosis not present

## 2021-02-02 DIAGNOSIS — R634 Abnormal weight loss: Secondary | ICD-10-CM | POA: Diagnosis not present

## 2021-02-02 DIAGNOSIS — E785 Hyperlipidemia, unspecified: Secondary | ICD-10-CM | POA: Diagnosis not present

## 2021-02-02 DIAGNOSIS — R630 Anorexia: Secondary | ICD-10-CM | POA: Diagnosis not present

## 2021-02-02 DIAGNOSIS — Z0000 Encounter for general adult medical examination without abnormal findings: Secondary | ICD-10-CM | POA: Diagnosis not present

## 2021-02-02 DIAGNOSIS — M461 Sacroiliitis, not elsewhere classified: Secondary | ICD-10-CM | POA: Diagnosis not present

## 2021-02-02 DIAGNOSIS — I129 Hypertensive chronic kidney disease with stage 1 through stage 4 chronic kidney disease, or unspecified chronic kidney disease: Secondary | ICD-10-CM | POA: Diagnosis not present

## 2021-02-02 DIAGNOSIS — I10 Essential (primary) hypertension: Secondary | ICD-10-CM | POA: Diagnosis not present

## 2021-02-02 DIAGNOSIS — N1832 Chronic kidney disease, stage 3b: Secondary | ICD-10-CM | POA: Diagnosis not present

## 2021-02-14 ENCOUNTER — Other Ambulatory Visit (HOSPITAL_COMMUNITY): Payer: Self-pay | Admitting: Physical Medicine & Rehabilitation

## 2021-02-14 ENCOUNTER — Other Ambulatory Visit: Payer: Self-pay | Admitting: Physical Medicine & Rehabilitation

## 2021-02-14 DIAGNOSIS — G8929 Other chronic pain: Secondary | ICD-10-CM | POA: Diagnosis not present

## 2021-02-14 DIAGNOSIS — M5442 Lumbago with sciatica, left side: Secondary | ICD-10-CM | POA: Diagnosis not present

## 2021-02-24 ENCOUNTER — Other Ambulatory Visit: Payer: Self-pay

## 2021-02-24 ENCOUNTER — Ambulatory Visit
Admission: RE | Admit: 2021-02-24 | Discharge: 2021-02-24 | Disposition: A | Discharge status: Home / Self Care | Payer: Medicare HMO | Source: Ambulatory Visit | Attending: Physical Medicine & Rehabilitation | Admitting: Physical Medicine & Rehabilitation

## 2021-02-24 DIAGNOSIS — M5442 Lumbago with sciatica, left side: Secondary | ICD-10-CM | POA: Diagnosis not present

## 2021-02-24 DIAGNOSIS — M4316 Spondylolisthesis, lumbar region: Secondary | ICD-10-CM | POA: Diagnosis not present

## 2021-02-24 DIAGNOSIS — M5126 Other intervertebral disc displacement, lumbar region: Secondary | ICD-10-CM | POA: Diagnosis not present

## 2021-02-24 DIAGNOSIS — G8929 Other chronic pain: Secondary | ICD-10-CM | POA: Diagnosis not present

## 2021-03-29 DIAGNOSIS — M48062 Spinal stenosis, lumbar region with neurogenic claudication: Secondary | ICD-10-CM | POA: Diagnosis not present

## 2021-03-29 DIAGNOSIS — G8929 Other chronic pain: Secondary | ICD-10-CM | POA: Diagnosis not present

## 2021-03-29 DIAGNOSIS — M5442 Lumbago with sciatica, left side: Secondary | ICD-10-CM | POA: Diagnosis not present

## 2021-04-10 DIAGNOSIS — M48062 Spinal stenosis, lumbar region with neurogenic claudication: Secondary | ICD-10-CM | POA: Diagnosis not present

## 2021-04-19 DIAGNOSIS — M25562 Pain in left knee: Secondary | ICD-10-CM | POA: Diagnosis not present

## 2021-04-19 DIAGNOSIS — M170 Bilateral primary osteoarthritis of knee: Secondary | ICD-10-CM | POA: Diagnosis not present

## 2021-04-19 DIAGNOSIS — M25561 Pain in right knee: Secondary | ICD-10-CM | POA: Diagnosis not present

## 2021-05-02 DIAGNOSIS — M48062 Spinal stenosis, lumbar region with neurogenic claudication: Secondary | ICD-10-CM | POA: Diagnosis not present

## 2021-05-02 DIAGNOSIS — M533 Sacrococcygeal disorders, not elsewhere classified: Secondary | ICD-10-CM | POA: Diagnosis not present

## 2021-05-02 DIAGNOSIS — M5442 Lumbago with sciatica, left side: Secondary | ICD-10-CM | POA: Diagnosis not present

## 2021-05-02 DIAGNOSIS — G8929 Other chronic pain: Secondary | ICD-10-CM | POA: Diagnosis not present

## 2021-05-03 DIAGNOSIS — M1711 Unilateral primary osteoarthritis, right knee: Secondary | ICD-10-CM | POA: Diagnosis not present

## 2021-05-09 ENCOUNTER — Ambulatory Visit: Payer: Medicare HMO | Admitting: Dermatology

## 2021-05-09 DIAGNOSIS — L820 Inflamed seborrheic keratosis: Secondary | ICD-10-CM

## 2021-05-09 DIAGNOSIS — L309 Dermatitis, unspecified: Secondary | ICD-10-CM

## 2021-05-09 DIAGNOSIS — Z85828 Personal history of other malignant neoplasm of skin: Secondary | ICD-10-CM | POA: Diagnosis not present

## 2021-05-09 DIAGNOSIS — Z872 Personal history of diseases of the skin and subcutaneous tissue: Secondary | ICD-10-CM

## 2021-05-09 DIAGNOSIS — L570 Actinic keratosis: Secondary | ICD-10-CM | POA: Diagnosis not present

## 2021-05-09 DIAGNOSIS — L578 Other skin changes due to chronic exposure to nonionizing radiation: Secondary | ICD-10-CM

## 2021-05-09 MED ORDER — HYDROCORTISONE 2.5 % EX CREA: TOPICAL_CREAM | Freq: Two times a day (BID) | CUTANEOUS | 1 refills | Status: DC | PRN

## 2021-05-09 NOTE — Patient Instructions (Addendum)

## 2021-05-09 NOTE — Progress Notes (Signed)
? ?Follow-Up Visit ?  ?Subjective  ?Robin Roberts is a 88 y.o. female who presents for the following: Follow-up (Patient here today for 6 month AK follow up. AK's treated at last visit with LN2 at hands, fingers and face. She would like to talk about doing PDT. ). ? ?Patient advises her skin is very dry, especially her face. She uses Dove soap and Vaseline.  ? ?The following portions of the chart were reviewed this encounter and updated as appropriate:  ?  ?  ? ?Review of Systems:  No other skin or systemic complaints except as noted in HPI or Assessment and Plan. ? ?Objective  ?Well appearing patient in no apparent distress; mood and affect are within normal limits. ? ?A focused examination was performed including face, hands. Relevant physical exam findings are noted in the Assessment and Plan. ? ?face ?Pink scaly patches at cheeks, nose, glabella and temples ? ?left upper lip vermillion ?7 mm waxy flesh scaly papule ? ? ? ?Assessment & Plan  ?Dermatitis ?face ? ?Seborrheic vrs irritant contact vrs diffuse actinic damage ? ?Start HC 2.5% cream twice daily for up to 2 weeks.  ? ?Start Eucerin roughness relief lotion to face qd/bid ? ?hydrocortisone 2.5 % cream - face ?Apply topically 2 (two) times daily as needed (Rash). For up to 2 weeks to face ? ?Inflamed seborrheic keratosis ?left upper lip vermillion ? ?If not resolved, will plan to biopsy.  ? ?Destruction of lesion - left upper lip vermillion ? ?Destruction method: cryotherapy   ?Informed consent: discussed and consent obtained   ?Lesion destroyed using liquid nitrogen: Yes   ?Region frozen until ice ball extended beyond lesion: Yes   ?Outcome: patient tolerated procedure well with no complications   ?Post-procedure details: wound care instructions given   ?Additional details:  Prior to procedure, discussed risks of blister formation, small wound, skin dyspigmentation, or rare scar following cryotherapy. Recommend Vaseline ointment to treated areas while  healing.  ? ? ?Actinic Damage - Severe, confluent actinic changes with pre-cancerous actinic keratoses  ?- Severe, chronic, not at goal, secondary to cumulative UV radiation exposure over time ?- diffuse scaly erythematous macules and papules with underlying dyspigmentation ?- Discussed Prescription "Field Treatment" for Severe, Chronic Confluent Actinic Changes with Pre-Cancerous Actinic Keratoses ?Field treatment involves treatment of an entire area of skin that has confluent Actinic Changes (Sun/ Ultraviolet light damage) and PreCancerous Actinic Keratoses by method of PhotoDynamic Therapy (PDT) and/or prescription Topical Chemotherapy agents such as 5-fluorouracil, 5-fluorouracil/calcipotriene, and/or imiquimod.  The purpose is to decrease the number of clinically evident and subclinical PreCancerous lesions to prevent progression to development of skin cancer by chemically destroying early precancer changes that may or may not be visible.  It has been shown to reduce the risk of developing skin cancer in the treated area. As a result of treatment, redness, scaling, crusting, and open sores may occur during treatment course. One or more than one of these methods may be used and may have to be used several times to control, suppress and eliminate the PreCancerous changes. Discussed treatment course, expected reaction, and possible side effects. ?- Recommend daily broad spectrum sunscreen SPF 30+ to sun-exposed areas, reapply every 2 hours as needed.  ?- Staying in the shade or wearing long sleeves, sun glasses (UVA+UVB protection) and wide brim hats (4-inch brim around the entire circumference of the hat) are also recommended. ?- Call for new or changing lesions. ?- Once dermatitis resolved at face, will schedule for PDT  face x 2. 1 month apart ? ?History of PreCancerous Actinic Keratosis  ?- site(s) of PreCancerous Actinic Keratosis clear today. ?- these may recur and new lesions may form requiring treatment to  prevent transformation into skin cancer ?- observe for new or changing spots and contact [place] for appointment if occur ?- photoprotection with sun protective clothing; sunglasses and broad spectrum sunscreen with SPF of at least 30 + and frequent self skin exams recommended ?- yearly exams by a dermatologist recommended for persons with history of PreCancerous Actinic Keratoses  ? ?History of Skin Cancer (BCC/SCC) ? ?Clear. Observe for recurrence.  ?Call clinic for new or changing lesions.   ?Recommend regular skin exams, daily broad-spectrum spf 30+ sunscreen use, and photoprotection.     ? ? ?Return in about 2 weeks (around 05/23/2021) for Dermatitis, recheck lip, possible PDT to face. ? ?Graciella Belton, RMA, am acting as scribe for Brendolyn Patty, MD . ? ?Documentation: I have reviewed the above documentation for accuracy and completeness, and I agree with the above. ? ?Brendolyn Patty MD  ? ?

## 2021-05-22 DIAGNOSIS — M533 Sacrococcygeal disorders, not elsewhere classified: Secondary | ICD-10-CM | POA: Diagnosis not present

## 2021-05-23 ENCOUNTER — Ambulatory Visit: Payer: Medicare HMO | Admitting: Dermatology

## 2021-05-23 DIAGNOSIS — L578 Other skin changes due to chronic exposure to nonionizing radiation: Secondary | ICD-10-CM | POA: Diagnosis not present

## 2021-05-23 DIAGNOSIS — L570 Actinic keratosis: Secondary | ICD-10-CM | POA: Diagnosis not present

## 2021-05-23 DIAGNOSIS — L309 Dermatitis, unspecified: Secondary | ICD-10-CM

## 2021-05-23 DIAGNOSIS — Z872 Personal history of diseases of the skin and subcutaneous tissue: Secondary | ICD-10-CM | POA: Diagnosis not present

## 2021-05-23 DIAGNOSIS — L820 Inflamed seborrheic keratosis: Secondary | ICD-10-CM

## 2021-05-23 NOTE — Patient Instructions (Addendum)
Actinic keratoses are precancerous spots that appear secondary to cumulative UV radiation exposure/sun exposure over time. They are chronic with expected duration over 1 year. A portion of actinic keratoses will progress to squamous cell carcinoma of the skin. It is not possible to reliably predict which spots will progress to skin cancer and so treatment is recommended to prevent development of skin cancer.  Recommend daily broad spectrum sunscreen SPF 30+ to sun-exposed areas, reapply every 2 hours as needed.  Recommend staying in the shade or wearing long sleeves, sun glasses (UVA+UVB protection) and wide brim hats (4-inch brim around the entire circumference of the hat). Call for new or changing lesions.    Cryotherapy Aftercare  Wash gently with soap and water everyday.   Apply Vaseline and Band-Aid daily until healed.   Levulan/PDT Treatment Common Side Effects  - Burning/stinging, which may be severe and last up to 24-72 hours after your treatment  - Redness, swelling and/or peeling which may last up to 4 weeks  - Scaling/crusting which may last up to 2 weeks  - Sun sensitivity (you MUST avoid sun exposure for 48-72 hours after treatment)  Care Instructions  - Okay to wash with soap and water and shampoo as normal  - If needed, you can do a cold compress (ex. Ice packs) for comfort  - If okay with your Primary Doctor, you may use analgesics such as Tylenol every 4-6 hours, not to exceed recommended dose  - You may apply Cerave Healing Ointment, Vaseline or Aquaphor  - If you have a lot of swelling you may take a Benadryl to help with this (this may cause drowsiness)  Sun Precautions  - Wear a wide brim hat for the next week if outside  - Wear a sunblock with zinc or titanium dioxide at least SPF 50 daily   We will recheck you in 10-12 weeks. If any problems, please call the office and ask to speak with a nurse.   If You Need Anything After Your Visit  If you have  any questions or concerns for your doctor, please call our main line at [phone] and press option 4 to reach your doctor's medical assistant. If no one answers, please leave a voicemail as directed and we will return your call as soon as possible. Messages left after 4 pm will be answered the following business day.   You may also send [place] a message via [place]. We typically respond to MyChart messages within 1-2 business days.  For prescription refills, please ask your pharmacy to contact our office. Our fax number is [phone].  If you have an urgent issue when the clinic is closed that cannot wait until the next business day, you can page your doctor at the number below.    Please note that while we do our best to be available for urgent issues outside of office hours, we are not available 24/7.   If you have an urgent issue and are unable to reach [place], you may choose to seek medical care at your doctor's office, retail clinic, urgent care center, or emergency room.  If you have a medical emergency, please immediately call 911 or go to the emergency department.  Pager Numbers  - Dr. Nehemiah Massed: [phone]  - Dr. Laurence Ferrari: [phone]  - Dr. Nicole Kindred: [phone]  In the event of inclement weather, please call our main line at [phone] for an update on the status of any delays or closures.  Dermatology Medication Tips: Please keep the  boxes that topical medications come in in order to help keep track of the instructions about where and how to use these. Pharmacies typically print the medication instructions only on the boxes and not directly on the medication tubes.   If your medication is too expensive, please contact our office at [phone] option 4 or send [place] a message through [place].   We are unable to tell what your co-pay for medications will be in advance as this is different depending on your insurance coverage. However, we may be able to find a substitute medication  at lower cost or fill out paperwork to get insurance to cover a needed medication.   If a prior authorization is required to get your medication covered by your insurance company, please allow [place] 1-2 business days to complete this process.  Drug prices often vary depending on where the prescription is filled and some pharmacies may offer cheaper prices.  The website www.goodrx.com contains coupons for medications through different pharmacies. The prices here do not account for what the cost may be with help from insurance (it may be cheaper with your insurance), but the website can give you the price if you did not use any insurance.  - You can print the associated coupon and take it with your prescription to the pharmacy.  - You may also stop by our office during regular business hours and pick up a GoodRx coupon card.  - If you need your prescription sent electronically to a different pharmacy, notify our office through [facility] or by phone at [phone] option 4.     Si Usted Necesita Algo Despus de Su Visita  Tambin puede enviarnos un mensaje a travs de Pharmacist, community. Por lo general respondemos a los mensajes de MyChart en el transcurso de 1 a 2 das hbiles.  Para renovar recetas, por favor pida a su farmacia que se ponga en contacto con nuestra oficina. Harland Dingwall de fax es [place] [phone].  Si tiene un asunto urgente cuando la clnica est cerrada y que no puede esperar hasta el siguiente da hbil, puede llamar/localizar a su doctor(a) al nmero que aparece a continuacin.   Por favor, tenga en cuenta que aunque hacemos todo lo posible para estar disponibles para asuntos urgentes fuera del horario de [place], no estamos disponibles las 24 horas del da, los 7 das de la [place].   Si tiene un problema urgente y no puede comunicarse con nosotros, puede optar por buscar atencin mdica  en el consultorio de su doctor(a), en una clnica privada, en un centro de atencin  urgente o en una sala de emergencias.  Si tiene Engineering geologist, por favor llame inmediatamente al 911 o vaya a la sala de emergencias.  Nmeros de bper  - Dr. Nehemiah Massed: [phone]  - Dra. Moye: [phone]  - Dra. Nicole Kindred: [phone]  En caso de inclemencias del [place], por favor llame a Johnsie Kindred principal al [phone] para una actualizacin sobre el [place] de cualquier retraso o cierre.  Consejos para la medicacin en dermatologa: Por favor, guarde las cajas en las que vienen los medicamentos de uso tpico para ayudarle a seguir las instrucciones sobre dnde y cmo usarlos. Las farmacias generalmente imprimen las instrucciones del medicamento slo en las cajas y no directamente en los tubos del [place].   Si su medicamento es muy caro, por favor, pngase en contacto con Zigmund Daniel llamando al [phone] y presione la opcin 4 o envenos un mensaje a travs de Pharmacist, community.  No podemos decirle cul ser su copago por los medicamentos por adelantado ya que esto es diferente dependiendo de la cobertura de su seguro. Sin embargo, es posible que podamos encontrar un medicamento sustituto a Electrical engineer un formulario para que el seguro cubra el medicamento que se considera necesario.   Si se requiere una autorizacin previa para que su compaa de seguros [place] su medicamento, por favor permtanos de 1 a 2 das hbiles para completar este proceso.  Los precios de los medicamentos varan con frecuencia dependiendo del Environmental consultant de dnde se surte la receta y alguna farmacias pueden ofrecer precios ms baratos.  El sitio web www.goodrx.com tiene cupones para medicamentos de Airline pilot. Los precios aqu no tienen en cuenta lo que podra costar con la ayuda del seguro (puede ser ms barato con su seguro), pero el sitio web puede darle el precio si no utiliz Research scientist (physical sciences).  - Puede imprimir el cupn correspondiente y llevarlo con su receta a la farmacia.  -  Tambin puede pasar por nuestra oficina durante el horario de atencin regular y Charity fundraiser una tarjeta de cupones de GoodRx.  - Si necesita que su receta se enve electrnicamente a una farmacia diferente, informe a nuestra oficina a travs de MyChart de [place] o por telfono llamando al [phone] y presione la opcin 4.

## 2021-05-23 NOTE — Progress Notes (Signed)
Follow-Up Visit   Subjective  Robin Roberts is a 88 y.o. female who presents for the following: Follow-up (Patient here to have dermatitis at face rechecked, reports has improved while on hc 2.5 % cream. She also has isk at left upper lip to recheck. She reports it has improved and healing. Also has hx of aks at face and discussed pdt treatment at last appointment. Patient is interested in PDT today. ).    The following portions of the chart were reviewed this encounter and updated as appropriate:      Review of Systems: No other skin or systemic complaints except as noted in HPI or Assessment and Plan.   Objective  Well appearing patient in no apparent distress; mood and affect are within normal limits.  A focused examination was performed including face, nose, left upper lip . Relevant physical exam findings are noted in the Assessment and Plan.  face Clear today   Left Upper Cutaneous Lip Clear at exam   nasal dorsum x 6 (6) Erythematous thin papules/macules with gritty scale.    Assessment & Plan  Dermatitis face  Resolved    Seborrheic vrs irritant contact vrs diffuse actinic damage   Use as needed HC 2.5% cream twice daily for up to 2 weeks prn flares  Use as needed  Eucerin roughness relief lotion to face qd/bid Continue daily spf 30+    Related Medications hydrocortisone 2.5 % cream Apply topically 2 (two) times daily as needed (Rash). For up to 2 weeks to face  Inflamed seborrheic keratosis Left Upper Cutaneous Lip  Treated with cryotherapy last visit- resolved      Actinic keratosis (6) nasal dorsum x 6  - Will schedule photodynamic therapy to the nose and face. Recommend 2 treatments 1 mo apart.    Actinic keratoses are precancerous spots that appear secondary to cumulative UV radiation exposure/sun exposure over time. They are chronic with expected duration over 1 year. A portion of actinic keratoses will progress to squamous cell carcinoma  of the skin. It is not possible to reliably predict which spots will progress to skin cancer and so treatment is recommended to prevent development of skin cancer.  Recommend daily broad spectrum sunscreen SPF 30+ to sun-exposed areas, reapply every 2 hours as needed.  Recommend staying in the shade or wearing long sleeves, sun glasses (UVA+UVB protection) and wide brim hats (4-inch brim around the entire circumference of the hat). Call for new or changing lesions.  Destruction of lesion - nasal dorsum x 6  Destruction method: cryotherapy   Informed consent: discussed and consent obtained   Timeout:  patient name, date of birth, surgical site, and procedure verified Lesion destroyed using liquid nitrogen: Yes   Region frozen until ice ball extended beyond lesion: Yes   Outcome: patient tolerated procedure well with no complications   Post-procedure details: wound care instructions given   Additional details:  Prior to procedure, discussed risks of blister formation, small wound, skin dyspigmentation, or rare scar following cryotherapy. Recommend Vaseline ointment to treated areas while healing..    Actinic Damage - Severe, confluent actinic changes with pre-cancerous actinic keratoses  - Severe, chronic, not at goal, secondary to cumulative UV radiation exposure over time - diffuse scaly erythematous macules and papules with underlying dyspigmentation - Discussed Prescription "Field Treatment" for Severe, Chronic Confluent Actinic Changes with Pre-Cancerous Actinic Keratoses Field treatment involves treatment of an entire area of skin that has confluent Actinic Changes (Sun/ Ultraviolet light damage) and  PreCancerous Actinic Keratoses by method of PhotoDynamic Therapy (PDT) and/or prescription Topical Chemotherapy agents such as 5-fluorouracil, 5-fluorouracil/calcipotriene, and/or imiquimod.  The purpose is to decrease the number of clinically evident and subclinical PreCancerous lesions to  prevent progression to development of skin cancer by chemically destroying early precancer changes that may or may not be visible.  It has been shown to reduce the risk of developing skin cancer in the treated area. As a result of treatment, redness, scaling, crusting, and open sores may occur during treatment course. One or more than one of these methods may be used and may have to be used several times to control, suppress and eliminate the PreCancerous changes. Discussed treatment course, expected reaction, and possible side effects. - Recommend daily broad spectrum sunscreen SPF 30+ to sun-exposed areas, reapply every 2 hours as needed.  - Staying in the shade or wearing long sleeves, sun glasses (UVA+UVB protection) and wide brim hats (4-inch brim around the entire circumference of the hat) are also recommended. - Call for new or changing lesions. - Will schedule photodynamic therapy to the face and nose x 2 treatments 1 mo apart.   Return for schedule 2 treatments for pdt to face and nose for aks, 6 month ak follow up. I, Ruthell Rummage, CMA, am acting as scribe for Brendolyn Patty, MD.  Documentation: I have reviewed the above documentation for accuracy and completeness, and I agree with the above.  Brendolyn Patty MD

## 2021-06-06 DIAGNOSIS — M533 Sacrococcygeal disorders, not elsewhere classified: Secondary | ICD-10-CM | POA: Diagnosis not present

## 2021-06-06 DIAGNOSIS — M48062 Spinal stenosis, lumbar region with neurogenic claudication: Secondary | ICD-10-CM | POA: Diagnosis not present

## 2021-06-06 DIAGNOSIS — G8929 Other chronic pain: Secondary | ICD-10-CM | POA: Diagnosis not present

## 2021-06-06 DIAGNOSIS — M5442 Lumbago with sciatica, left side: Secondary | ICD-10-CM | POA: Diagnosis not present

## 2021-06-20 ENCOUNTER — Ambulatory Visit (INDEPENDENT_AMBULATORY_CARE_PROVIDER_SITE_OTHER): Payer: Medicare HMO | Admitting: Dermatology

## 2021-06-20 DIAGNOSIS — L570 Actinic keratosis: Secondary | ICD-10-CM

## 2021-06-20 MED ORDER — AMINOLEVULINIC ACID HCL 20 % EX SOLR
1.0000 | Freq: Once | CUTANEOUS | Status: AC
  Administered 2021-06-20: 354 mg via TOPICAL

## 2021-06-20 NOTE — Progress Notes (Signed)
Patient completed PDT therapy today.  1. AK (actinic keratosis) Head - Anterior (Face)  Photodynamic therapy - Head - Anterior (Face) Procedure discussed: discussed risks, benefits, side effects. and alternatives   Prep: site scrubbed/prepped with acetone   Location:  Face Number of lesions:  Multiple Type of treatment:  Blue light Aminolevulinic Acid (see MAR for details): Levulan Number of Levulan sticks used:  1 Incubation time (minutes):  60 Number of minutes under lamp:  16 Number of seconds under lamp:  40 Cooling:  Floor fan Outcome: patient tolerated procedure well with no complications   Post-procedure details: sunscreen applied and aftercare instructions given to patient    Related Medications Aminolevulinic Acid HCl 20 % SOLR 354 mg   Patient provided Solbar sunscreen and vanicream moisturizer.  Dicie Beam RMA  Documentation: I have reviewed the above documentation for accuracy and completeness, and I agree with the above.  Brendolyn Patty MD

## 2021-06-20 NOTE — Patient Instructions (Signed)

## 2021-08-02 DIAGNOSIS — R630 Anorexia: Secondary | ICD-10-CM | POA: Diagnosis not present

## 2021-08-02 DIAGNOSIS — M48062 Spinal stenosis, lumbar region with neurogenic claudication: Secondary | ICD-10-CM | POA: Diagnosis not present

## 2021-08-02 DIAGNOSIS — R634 Abnormal weight loss: Secondary | ICD-10-CM | POA: Diagnosis not present

## 2021-08-02 DIAGNOSIS — E785 Hyperlipidemia, unspecified: Secondary | ICD-10-CM | POA: Diagnosis not present

## 2021-08-02 DIAGNOSIS — I10 Essential (primary) hypertension: Secondary | ICD-10-CM | POA: Diagnosis not present

## 2021-09-19 IMAGING — MG MM DIGITAL SCREENING BILAT W/ TOMO AND CAD
6 of 12 series · 6 of 36 positions shown · non-contrast
Comparison: Previous exam(s).

CLINICAL DATA: Screening.

EXAM:
DIGITAL SCREENING BILATERAL MAMMOGRAM WITH TOMOSYNTHESIS AND CAD
TECHNIQUE: Bilateral screening digital craniocaudal and mediolateral oblique
mammograms were obtained. Bilateral screening digital breast
tomosynthesis was performed. The images were evaluated with
computer-aided detection.

[L MLO synth-2D (1 of 2)]
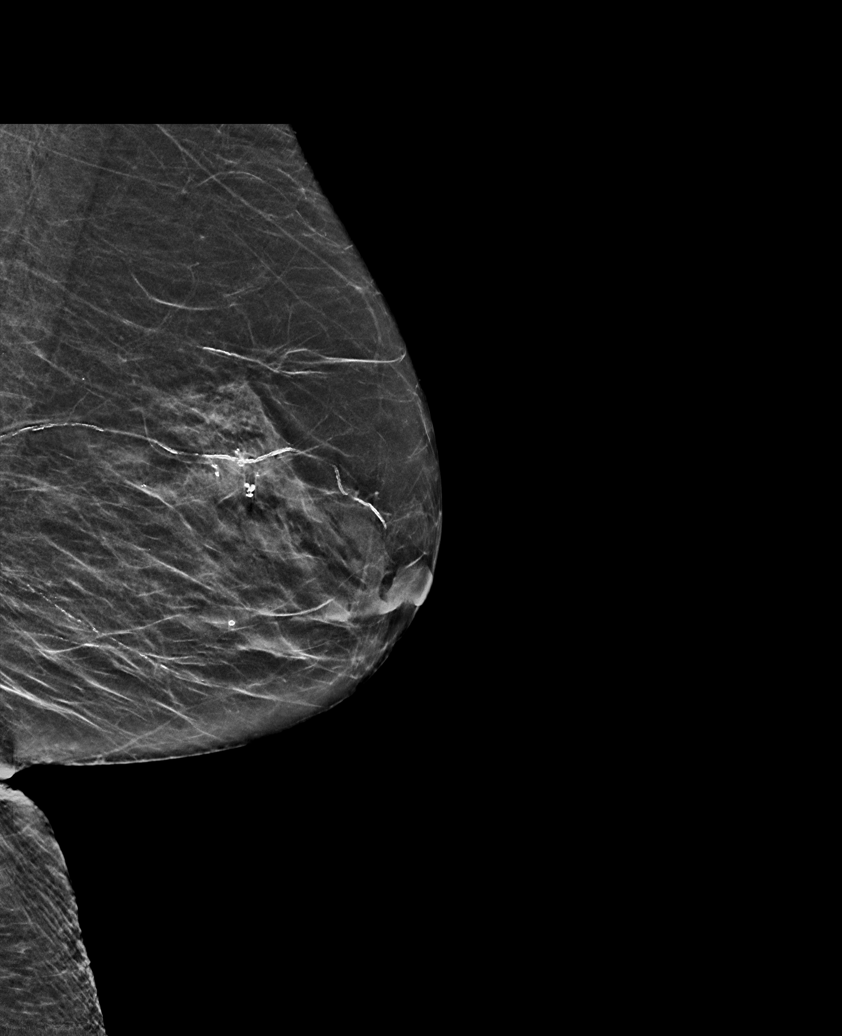

[L CC synth-2D]
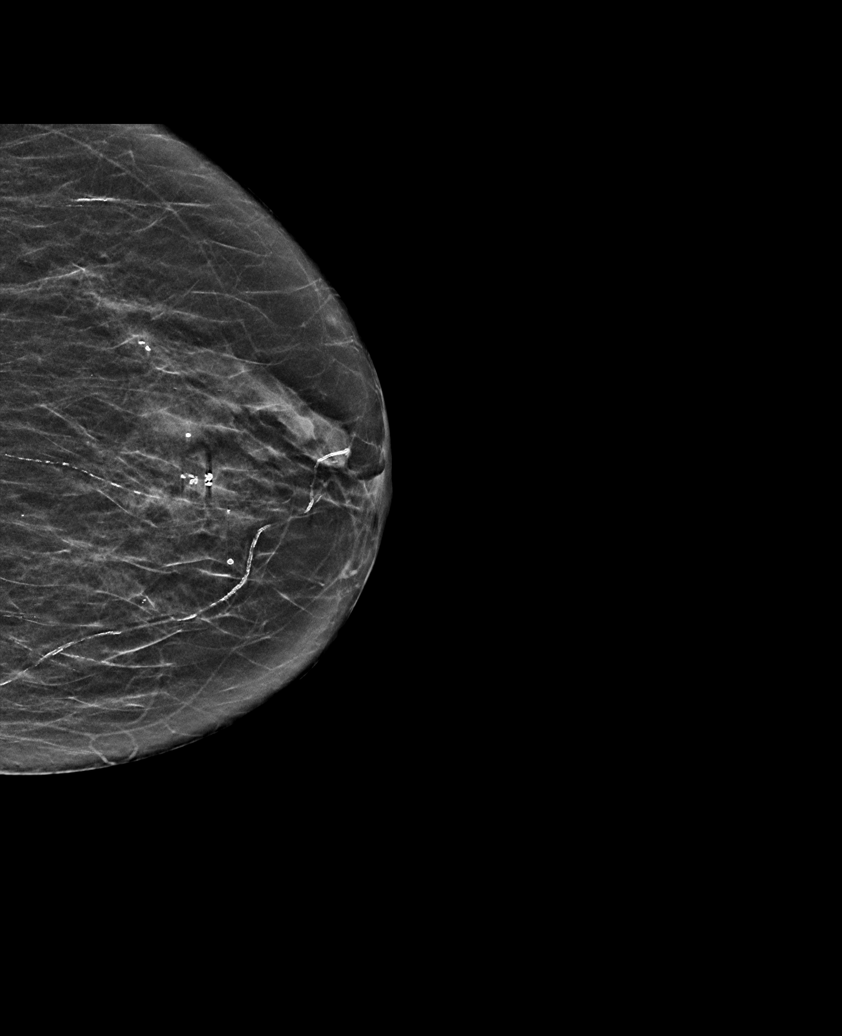

[R CC synth-2D]
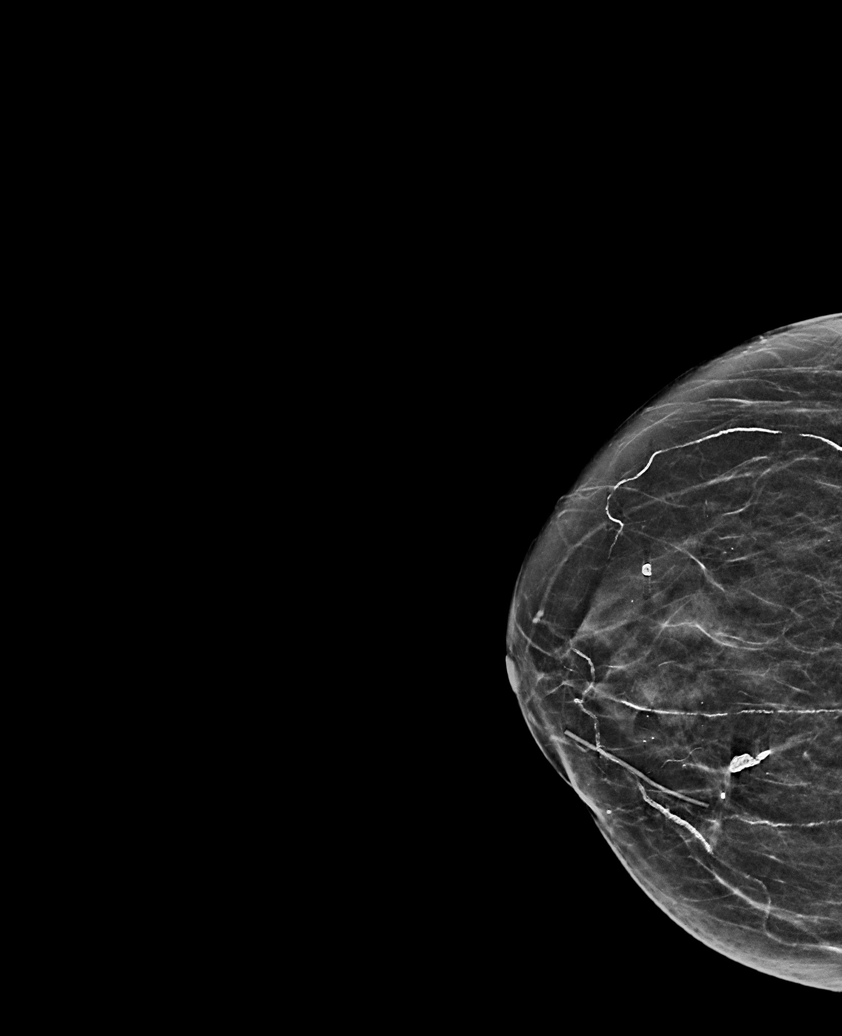

[L MLO synth-2D (2 of 2)]
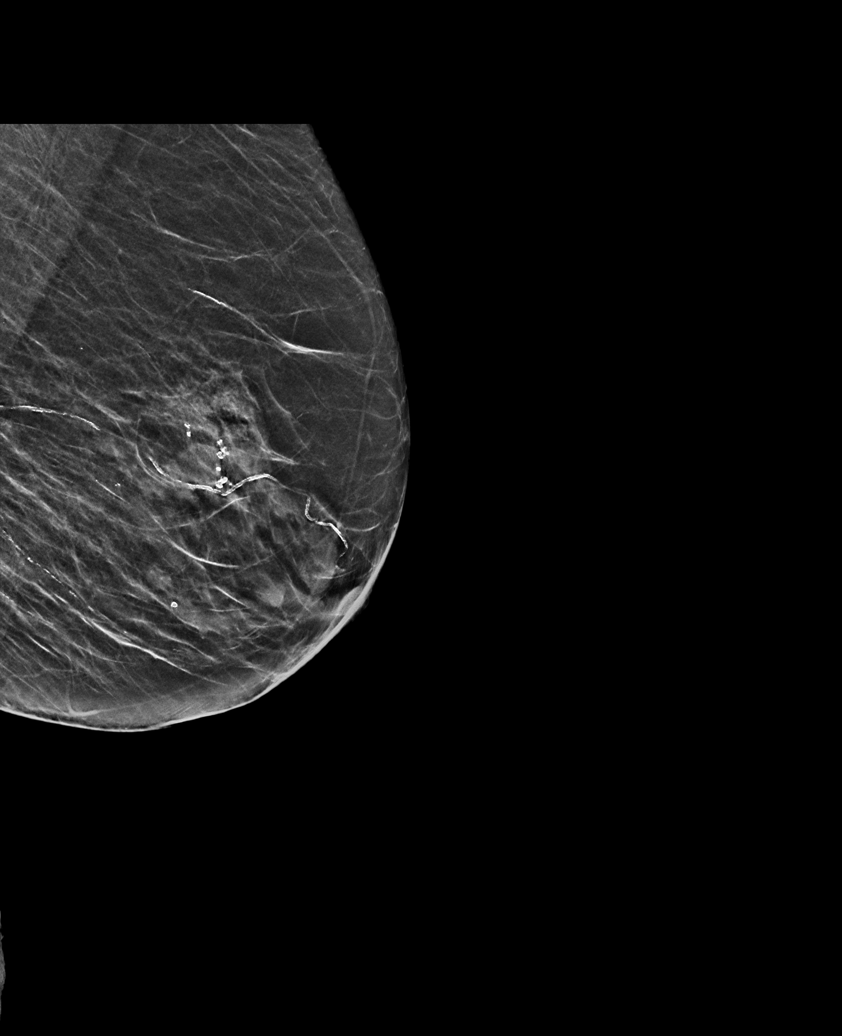

[R MLO synth-2D (1 of 2)]
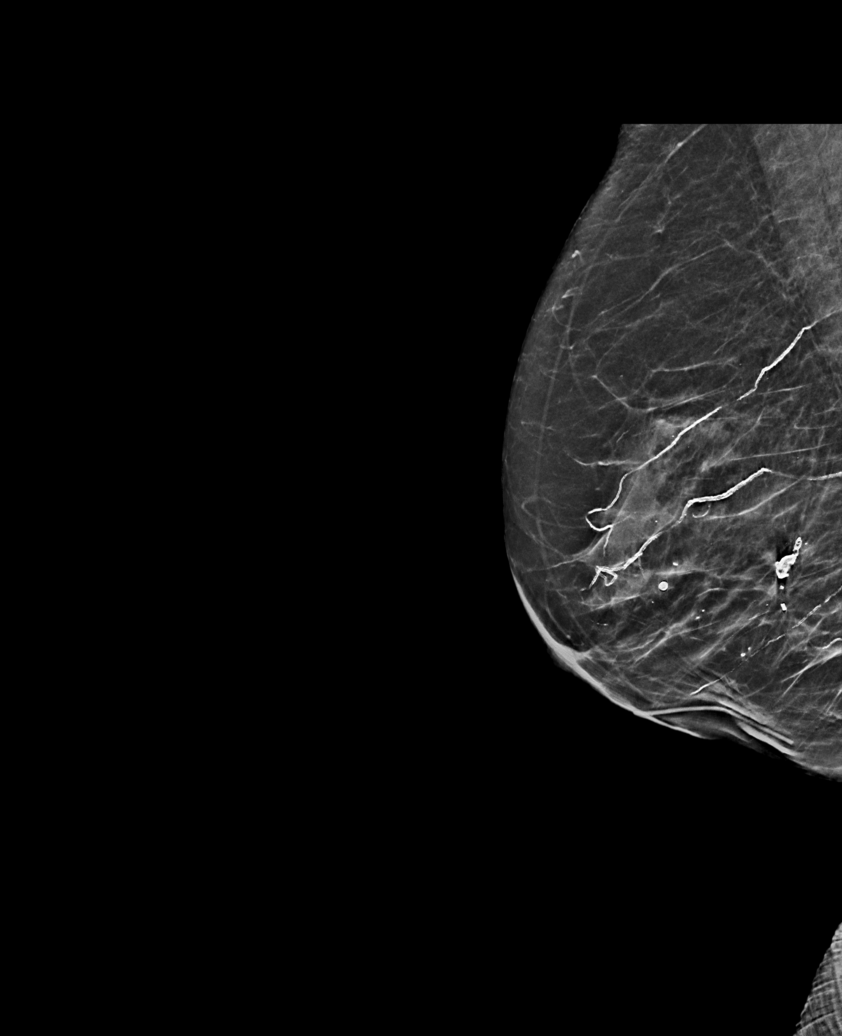

[R MLO synth-2D (2 of 2)]
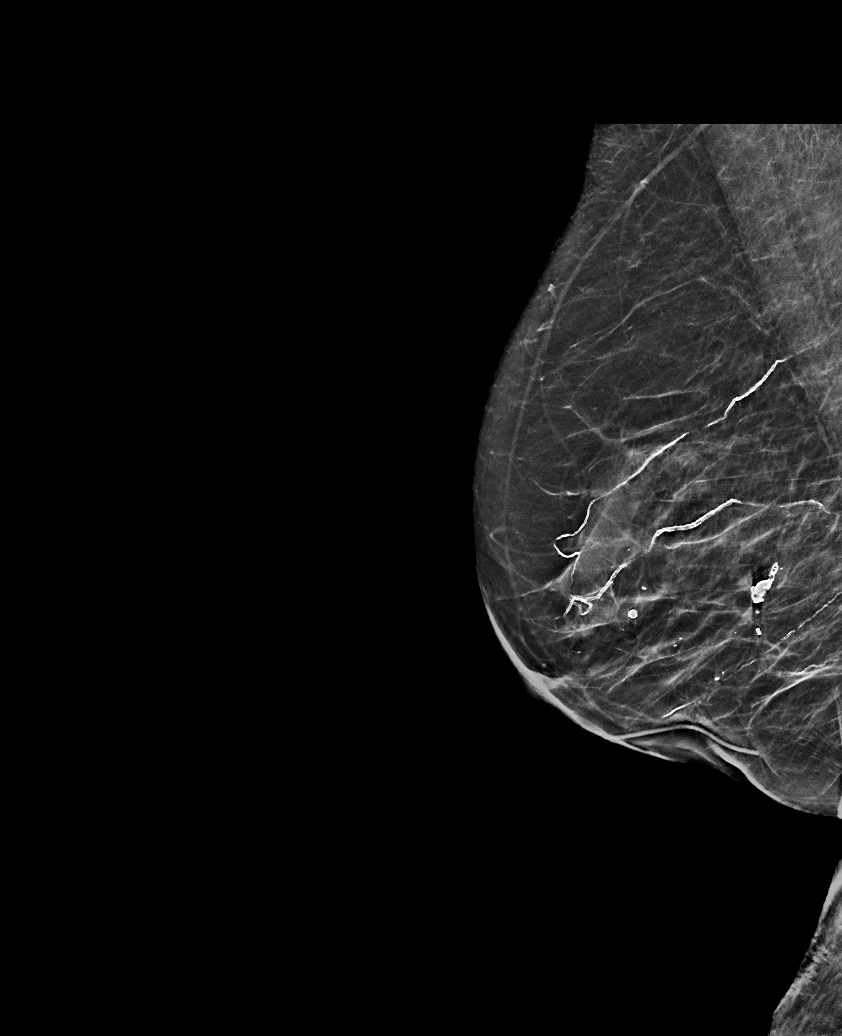

[6 of 36 positions shown; findings below may reference images not displayed]

ACR Breast Density Category b: There are scattered areas of
fibroglandular density.
FINDINGS: There are no findings suspicious for malignancy. The images were
evaluated with computer-aided detection.
IMPRESSION: No mammographic evidence of malignancy. A result letter of this
screening mammogram will be mailed directly to the patient.

RECOMMENDATION:
Screening mammogram in one year. (Code:WJ-I-BG6)

BI-RADS CATEGORY  1: Negative.

## 2021-12-05 ENCOUNTER — Ambulatory Visit: Payer: Medicare HMO | Admitting: Dermatology

## 2021-12-05 VITALS — BP 122/77 | HR 99

## 2021-12-05 DIAGNOSIS — L578 Other skin changes due to chronic exposure to nonionizing radiation: Secondary | ICD-10-CM | POA: Diagnosis not present

## 2021-12-05 DIAGNOSIS — L814 Other melanin hyperpigmentation: Secondary | ICD-10-CM | POA: Diagnosis not present

## 2021-12-05 DIAGNOSIS — L821 Other seborrheic keratosis: Secondary | ICD-10-CM | POA: Diagnosis not present

## 2021-12-05 DIAGNOSIS — L570 Actinic keratosis: Secondary | ICD-10-CM | POA: Diagnosis not present

## 2021-12-05 NOTE — Patient Instructions (Addendum)
Actinic keratoses are precancerous spots that appear secondary to cumulative UV radiation exposure/sun exposure over time. They are chronic with expected duration over 1 year. A portion of actinic keratoses will progress to squamous cell carcinoma of the skin. It is not possible to reliably predict which spots will progress to skin cancer and so treatment is recommended to prevent development of skin cancer.  Recommend daily broad spectrum sunscreen SPF 30+ to sun-exposed areas, reapply every 2 hours as needed.  Recommend staying in the shade or wearing long sleeves, sun glasses (UVA+UVB protection) and wide brim hats (4-inch brim around the entire circumference of the hat). Call for new or changing lesions.   Cryotherapy Aftercare  Wash gently with soap and water everyday.   Apply Vaseline and Band-Aid daily until healed.    Photodynamic Therapy/Red Light Therapy  Actinic keratoses are the dry, red scaly spots on the skin caused by sun damage. A portion of these spots can turn into skin cancer with time, and treating them can help prevent development of skin cancer.   Treatment of these spots requires removal of the defective skin cells. There are various ways to remove actinic keratoses, including freezing with liquid nitrogen, treatment with creams, or treatment with a blue light procedure in the office.   Photodynamic Therapy (PDT), also known as "Red light therapy" is an in office procedure used to treat actinic keratoses. It works by targeting precancerous cells. After treatment, these cells peel off and are replaced by healthy ones.   For your phototherapy appointment, you will have two appointments on the day of your treatment. The first appointment will be to apply a cream to the treatment area. You will leave this cream on for 1-2 hours depending on the area being treated. The second appointment will be to shine a Red light on the area for 16 minutes to kill off the precancer cells. It  is common to experience a burning sensation during the treatment.  After your treatment, it will be important to keep the treated areas of skin out of the sun completely for 48-72 hours (2-3 days) to prevent having a reaction.   Common side effects include: - Burning or stinging, which may be severe and can last up to 24-72 hours after your treatment - Scaling and crusting which may last up to 2 weeks - Redness, swelling and/or peeling which can last up to 4 weeks  To Care for Your Skin After PDT/Red Light Therapy: - Wash with soap, water and shampoo as normal. - If needed, you can use cold compresses (e.g. ice packs) for comfort - If okay with your primary care doctor, you may use analgesics such as acetaminophen (tylenol) every 4-6 hours, not to exceed recommended dose - You may apply Cerave Healing Ointment, Vaseline or Aquaphor as needed - If you have a lot of swelling you may take a Benadryl to help with this (this may cause drowsiness), not to exceed recommended dose. This may increase the risk of falls in people over 65 and may slow reaction time while driving, so it is not recommended to take before driving or operating machinery. - Sun Precautions - Wear a wide brim hat for the next week if outside  - Wear a sunblock with zinc or titanium dioxide at least SPF 50 daily  If you have any questions or concerns, please call the office and ask to speak with a nurse.   --------------------------------------------------------------------------------------------------------------       Due to recent changes  in healthcare laws, you may see results of your pathology and/or laboratory studies on MyChart before the doctors have had a chance to review them. We understand that in some cases there may be results that are confusing or concerning to you. Please understand that not all results are received at the same time and often the doctors may need to interpret multiple results in order to  provide you with the best plan of care or course of treatment. Therefore, we ask that you please give [place] 2 business days to thoroughly review all your results before contacting the office for clarification. Should we see a critical lab result, you will be contacted sooner.   If You Need Anything After Your Visit  If you have any questions or concerns for your doctor, please call our main line at [phone] and press option 4 to reach your doctor's medical assistant. If no one answers, please leave a voicemail as directed and we will return your call as soon as possible. Messages left after 4 pm will be answered the following business day.   You may also send [place] a message via [place]. We typically respond to MyChart messages within 1-2 business days.  For prescription refills, please ask your pharmacy to contact our office. Our fax number is [phone].  If you have an urgent issue when the clinic is closed that cannot wait until the next business day, you can page your doctor at the number below.    Please note that while we do our best to be available for urgent issues outside of office hours, we are not available 24/7.   If you have an urgent issue and are unable to reach [place], you may choose to seek medical care at your doctor's office, retail clinic, urgent care center, or emergency room.  If you have a medical emergency, please immediately call 911 or go to the emergency department.  Pager Numbers  - Dr. Nehemiah Massed: [phone]  - Dr. Laurence Ferrari: [phone]  - Dr. Nicole Kindred: [phone]  In the event of inclement weather, please call our main line at [phone] for an update on the status of any delays or closures.  Dermatology Medication Tips: Please keep the boxes that topical medications come in in order to help keep track of the instructions about where and how to use these. Pharmacies typically print the medication instructions only on the boxes and not directly on the  medication tubes.   If your medication is too expensive, please contact our office at [phone] option 4 or send [place] a message through [place].   We are unable to tell what your co-pay for medications will be in advance as this is different depending on your insurance coverage. However, we may be able to find a substitute medication at lower cost or fill out paperwork to get insurance to cover a needed medication.   If a prior authorization is required to get your medication covered by your insurance company, please allow [place] 1-2 business days to complete this process.  Drug prices often vary depending on where the prescription is filled and some pharmacies may offer cheaper prices.  The website www.goodrx.com contains coupons for medications through different pharmacies. The prices here do not account for what the cost may be with help from insurance (it may be cheaper with your insurance), but the website can give you the price if you did not use any insurance.  - You can print the associated coupon and take it with your prescription to  the pharmacy.  - You may also stop by our office during regular business hours and pick up a GoodRx coupon card.  - If you need your prescription sent electronically to a different pharmacy, notify our office through [facility] or by phone at [phone] option 4.     Si Usted Necesita Algo Despus de Su Visita  Tambin puede enviarnos un mensaje a travs de Pharmacist, community. Por lo general respondemos a los mensajes de MyChart en el transcurso de 1 a 2 das hbiles.  Para renovar recetas, por favor pida a su farmacia que se ponga en contacto con nuestra oficina. Harland Dingwall de fax es [place] [phone].  Si tiene un asunto urgente cuando la clnica est cerrada y que no puede esperar hasta el siguiente da hbil, puede llamar/localizar a su doctor(a) al nmero que aparece a continuacin.   Por favor, tenga en cuenta que aunque hacemos todo lo posible  para estar disponibles para asuntos urgentes fuera del horario de [place], no estamos disponibles las 24 horas del da, los 7 das de la [place].   Si tiene un problema urgente y no puede comunicarse con nosotros, puede optar por buscar atencin mdica  en el consultorio de su doctor(a), en una clnica privada, en un centro de atencin urgente o en una sala de emergencias.  Si tiene Engineering geologist, por favor llame inmediatamente al 911 o vaya a la sala de emergencias.  Nmeros de bper  - Dr. Nehemiah Massed: [phone]  - Dra. Moye: [phone]  - Dra. Nicole Kindred: [phone]  En caso de inclemencias del [place], por favor llame a Johnsie Kindred principal al [phone] para una actualizacin sobre el [place] de cualquier retraso o cierre.  Consejos para la medicacin en dermatologa: Por favor, guarde las cajas en las que vienen los medicamentos de uso tpico para ayudarle a seguir las instrucciones sobre dnde y cmo usarlos. Las farmacias generalmente imprimen las instrucciones del medicamento slo en las cajas y no directamente en los tubos del [place].   Si su medicamento es muy caro, por favor, pngase en contacto con Zigmund Daniel llamando al [phone] y presione la opcin 4 o envenos un mensaje a travs de Pharmacist, community.   No podemos decirle cul ser su copago por los medicamentos por adelantado ya que esto es diferente dependiendo de la cobertura de su seguro. Sin embargo, es posible que podamos encontrar un medicamento sustituto a Electrical engineer un formulario para que el seguro cubra el medicamento que se considera necesario.   Si se requiere una autorizacin previa para que su compaa de seguros [place] su medicamento, por favor permtanos de 1 a 2 das hbiles para completar este proceso.  Los precios de los medicamentos varan con frecuencia dependiendo del Environmental consultant de dnde se surte la receta y alguna farmacias pueden ofrecer precios ms baratos.  El sitio web  www.goodrx.com tiene cupones para medicamentos de Airline pilot. Los precios aqu no tienen en cuenta lo que podra costar con la ayuda del seguro (puede ser ms barato con su seguro), pero el sitio web puede darle el precio si no utiliz Research scientist (physical sciences).  - Puede imprimir el cupn correspondiente y llevarlo con su receta a la farmacia.  - Tambin puede pasar por nuestra oficina durante el horario de atencin regular y Charity fundraiser una tarjeta de cupones de GoodRx.  - Si necesita que su receta se enve electrnicamente a Chiropodist, informe a nuestra oficina a travs [place] o por telfono  llamando al [phone] y presione la opcin 4.

## 2021-12-05 NOTE — Progress Notes (Signed)
Follow-Up Visit   Subjective  Robin Roberts is a 88 y.o. female who presents for the following: Follow-up (6 month ak follow up. Patient was treated with blue light therapy at face and did get very red and crusty.). Only had one treatment and is interested in doing a second treatment.  The patient has spots, moles and lesions to be evaluated, some may be new or changing and the patient has concerns that these could be cancer.  The following portions of the chart were reviewed this encounter and updated as appropriate:      Review of Systems: No other skin or systemic complaints except as noted in HPI or Assessment and Plan.   Objective  Well appearing patient in no apparent distress; mood and affect are within normal limits.  A focused examination was performed including face. Relevant physical exam findings are noted in the Assessment and Plan.  left medial cheek x 2, left nasal dorsum x 3, left nasal alar crease x1, left paranasal x1 (7) Erythematous thin papules/macules with gritty scale.    Assessment & Plan  Actinic keratosis (7) left medial cheek x 2, left nasal dorsum x 3, left nasal alar crease x1, left paranasal x1   Vs isk at left paranasal  recheck at follow up   Actinic keratoses are precancerous spots that appear secondary to cumulative UV radiation exposure/sun exposure over time. They are chronic with expected duration over 1 year. A portion of actinic keratoses will progress to squamous cell carcinoma of the skin. It is not possible to reliably predict which spots will progress to skin cancer and so treatment is recommended to prevent development of skin cancer.  Recommend daily broad spectrum sunscreen SPF 30+ to sun-exposed areas, reapply every 2 hours as needed.  Recommend staying in the shade or wearing long sleeves, sun glasses (UVA+UVB protection) and wide brim hats (4-inch brim around the entire circumference of the hat). Call for new or changing  lesions.  Discussed doing Red light treatment to face. Patient scheduled to come back in January for Red Light treatment with debridement to face.    Destruction of lesion - left medial cheek x 2, left nasal dorsum x 3, left nasal alar crease x1, left paranasal x1  Destruction method: cryotherapy   Informed consent: discussed and consent obtained   Lesion destroyed using liquid nitrogen: Yes   Region frozen until ice ball extended beyond lesion: Yes   Outcome: patient tolerated procedure well with no complications   Post-procedure details: wound care instructions given   Additional details:  Prior to procedure, discussed risks of blister formation, small wound, skin dyspigmentation, or rare scar following cryotherapy. Recommend Vaseline ointment to treated areas while healing.    Seborrheic Keratoses - Stuck-on, waxy, tan-brown papules and/or plaques  - Benign-appearing - Discussed benign etiology and prognosis. - Observe - Call for any changes  Lentigines - Scattered tan macules - Due to sun exposure - Benign-appearing, observe - Recommend daily broad spectrum sunscreen SPF 30+ to sun-exposed areas, reapply every 2 hours as needed. - Call for any changes   Actinic Damage with PreCancerous Actinic Keratoses Counseling for Topical Chemotherapy Management: Patient exhibits: - Severe, confluent actinic changes with pre-cancerous actinic keratoses that is secondary to cumulative UV radiation exposure over time - Condition that is severe; chronic, not at goal. - diffuse scaly erythematous macules and papules with underlying dyspigmentation - Discussed Prescription "Field Treatment" topical Chemotherapy for Severe, Chronic Confluent Actinic Changes with Pre-Cancerous Actinic Keratoses  Field treatment involves treatment of an entire area of skin that has confluent Actinic Changes (Sun/ Ultraviolet light damage) and PreCancerous Actinic Keratoses by method of PhotoDynamic Therapy (PDT)  and/or prescription Topical Chemotherapy agents such as 5-fluorouracil, 5-fluorouracil/calcipotriene, and/or imiquimod.  The purpose is to decrease the number of clinically evident and subclinical PreCancerous lesions to prevent progression to development of skin cancer by chemically destroying early precancer changes that may or may not be visible.  It has been shown to reduce the risk of developing skin cancer in the treated area. As a result of treatment, redness, scaling, crusting, and open sores may occur during treatment course. One or more than one of these methods may be used and may have to be used several times to control, suppress and eliminate the PreCancerous changes. Discussed treatment course, expected reaction, and possible side effects. - Recommend daily broad spectrum sunscreen SPF 30+ to sun-exposed areas, reapply every 2 hours as needed.  - Staying in the shade or wearing long sleeves, sun glasses (UVA+UVB protection) and wide brim hats (4-inch brim around the entire circumference of the hat) are also recommended. - Call for new or changing lesions.  - Will schedule Red light photodynamic therapy to the face   Return for red light with debridement at face in january, 6 month ak follow up. I, Ruthell Rummage, CMA, am acting as scribe for Brendolyn Patty, MD.  Documentation: I have reviewed the above documentation for accuracy and completeness, and I agree with the above.  Brendolyn Patty MD

## 2022-01-23 ENCOUNTER — Ambulatory Visit: Payer: Medicare HMO

## 2022-01-30 DIAGNOSIS — I10 Essential (primary) hypertension: Secondary | ICD-10-CM | POA: Diagnosis not present

## 2022-01-30 DIAGNOSIS — E785 Hyperlipidemia, unspecified: Secondary | ICD-10-CM | POA: Diagnosis not present

## 2022-02-06 DIAGNOSIS — I10 Essential (primary) hypertension: Secondary | ICD-10-CM | POA: Diagnosis not present

## 2022-02-06 DIAGNOSIS — K219 Gastro-esophageal reflux disease without esophagitis: Secondary | ICD-10-CM | POA: Diagnosis not present

## 2022-02-06 DIAGNOSIS — E785 Hyperlipidemia, unspecified: Secondary | ICD-10-CM | POA: Diagnosis not present

## 2022-02-06 DIAGNOSIS — M5430 Sciatica, unspecified side: Secondary | ICD-10-CM | POA: Diagnosis not present

## 2022-02-06 DIAGNOSIS — I129 Hypertensive chronic kidney disease with stage 1 through stage 4 chronic kidney disease, or unspecified chronic kidney disease: Secondary | ICD-10-CM | POA: Diagnosis not present

## 2022-02-06 DIAGNOSIS — R000 Tachycardia, unspecified: Secondary | ICD-10-CM | POA: Diagnosis not present

## 2022-02-06 DIAGNOSIS — Z1331 Encounter for screening for depression: Secondary | ICD-10-CM | POA: Diagnosis not present

## 2022-02-06 DIAGNOSIS — R634 Abnormal weight loss: Secondary | ICD-10-CM | POA: Diagnosis not present

## 2022-02-06 DIAGNOSIS — Z0000 Encounter for general adult medical examination without abnormal findings: Secondary | ICD-10-CM | POA: Diagnosis not present

## 2022-06-19 ENCOUNTER — Ambulatory Visit: Payer: Medicare HMO | Admitting: Dermatology

## 2022-06-19 ENCOUNTER — Encounter: Payer: Self-pay | Admitting: Dermatology

## 2022-06-19 VITALS — BP 137/81 | HR 120

## 2022-06-19 DIAGNOSIS — L578 Other skin changes due to chronic exposure to nonionizing radiation: Secondary | ICD-10-CM | POA: Diagnosis not present

## 2022-06-19 DIAGNOSIS — L570 Actinic keratosis: Secondary | ICD-10-CM

## 2022-06-19 DIAGNOSIS — M67449 Ganglion, unspecified hand: Secondary | ICD-10-CM

## 2022-06-19 DIAGNOSIS — W908XXA Exposure to other nonionizing radiation, initial encounter: Secondary | ICD-10-CM

## 2022-06-19 MED ORDER — DOXYCYCLINE MONOHYDRATE 100 MG PO CAPS
100.0000 mg | ORAL_CAPSULE | Freq: Two times a day (BID) | ORAL | 0 refills | Status: AC
Start: 2022-06-19 — End: 2022-06-29

## 2022-06-19 MED ORDER — MUPIROCIN 2 % EX OINT: TOPICAL_OINTMENT | CUTANEOUS | 1 refills | Status: DC

## 2022-06-19 NOTE — Progress Notes (Signed)
Follow-Up Visit   Subjective  Robin Roberts is a 89 y.o. female who presents for the following: 6 month AK follow up. Tx with LN2. left medial cheek, left nasal dorsum, left nasal alar crease, left paranasal. Did not return for PDT in January. Listed as a no show.  Patient states she forgot the appointment.   The patient has spots, moles and lesions to be evaluated, some may be new or changing and the patient has concerns that these could be cancer.    The following portions of the chart were reviewed this encounter and updated as appropriate: medications, allergies, medical history  Review of Systems:  No other skin or systemic complaints except as noted in HPI or Assessment and Plan.  Objective  Well appearing patient in no apparent distress; mood and affect are within normal limits.  A focused examination was performed of the following areas: Face, ears, hands  Relevant physical exam findings are noted in the Assessment and Plan.  left medial cheek x2, right upper eyebrow x1, left nasal ala x1 (4) Erythematous thin papules/macules with gritty scale.     Assessment & Plan   AK (actinic keratosis) (4) left medial cheek x2, right upper eyebrow x1, left nasal ala x1  Actinic keratoses are precancerous spots that appear secondary to cumulative UV radiation exposure/sun exposure over time. They are chronic with expected duration over 1 year. A portion of actinic keratoses will progress to squamous cell carcinoma of the skin. It is not possible to reliably predict which spots will progress to skin cancer and so treatment is recommended to prevent development of skin cancer.  Recommend daily broad spectrum sunscreen SPF 30+ to sun-exposed areas, reapply every 2 hours as needed.  Recommend staying in the shade or wearing long sleeves, sun glasses (UVA+UVB protection) and wide brim hats (4-inch brim around the entire circumference of the hat). Call for new or changing  lesions.  Destruction of lesion - left medial cheek x2, right upper eyebrow x1, left nasal ala x1  Destruction method: cryotherapy   Informed consent: discussed and consent obtained   Lesion destroyed using liquid nitrogen: Yes   Region frozen until ice ball extended beyond lesion: Yes   Outcome: patient tolerated procedure well with no complications   Post-procedure details: wound care instructions given   Additional details:  Prior to procedure, discussed risks of blister formation, small wound, skin dyspigmentation, or rare scar following cryotherapy. Recommend Vaseline ointment to treated areas while healing.    DIGITAL MUCOUS CYST, Inflamed Vs Gout Patient has had gout at great toe.  Exam: 0.7 cm white soft nodule with surrounding erythema, tender to touch.  A digital mucous cyst also known as a myxoid cyst or pseudocyst is a ganglion cyst arising from the distal interphalangeal (DIP) joint of the finger or thumb (or, less commonly, toe). The cysts are believed to form from degeneration of connective tissue and are associated with osteoarthritic joints or injury. Although the exact etiology is unknown, it is likely that a small tear forms in a joint capsule or tendon sheath, allowing extravasation of synovial fluid into the adjacent tissue. When the fluid reacts with local tissue, it becomes more gelatinous and a cyst wall forms. With any treatment, there is a high rate of recurrence.   Treatment options include: - Puncture / Incision & Drainage (I&D) - Intralesional steroid injection - Intralesional Sclerosant injection (Asclera/ Polidocanol) - Intralesional steroid + sclerosant - Corticosteroid tape - Cryosurgery - Laser (CO2) -  Infrared photocoagulation - Excision / Surgery  Treatment Plan: Start Mupirocin apply with bandage change.  Take doxycycline 100 mg tablet by mouth twice a day for 10 days. #20, 0 refills Consider I&D if doesn't improve  Doxycycline should be taken  with food to prevent nausea. Do not lay down for 30 minutes after taking. Be cautious with sun exposure and use good sun protection while on this medication. Pregnant women should not take this medication.   Pt will address possible gout with PCP next week if not improving.   ACTINIC DAMAGE WITH PRECANCEROUS ACTINIC KERATOSES Counseling for Topical Chemotherapy Management: Patient exhibits: - Severe, confluent actinic changes with pre-cancerous actinic keratoses that is secondary to cumulative UV radiation exposure over time - Condition that is severe; chronic, not at goal. - diffuse scaly erythematous macules and papules with underlying dyspigmentation - Discussed Prescription "Field Treatment" topical Chemotherapy for Severe, Chronic Confluent Actinic Changes with Pre-Cancerous Actinic Keratoses Field treatment involves treatment of an entire area of skin that has confluent Actinic Changes (Sun/ Ultraviolet light damage) and PreCancerous Actinic Keratoses by method of PhotoDynamic Therapy (PDT) and/or prescription Topical Chemotherapy agents such as 5-fluorouracil, 5-fluorouracil/calcipotriene, and/or imiquimod.  The purpose is to decrease the number of clinically evident and subclinical PreCancerous lesions to prevent progression to development of skin cancer by chemically destroying early precancer changes that may or may not be visible.  It has been shown to reduce the risk of developing skin cancer in the treated area. As a result of treatment, redness, scaling, crusting, and open sores may occur during treatment course. One or more than one of these methods may be used and may have to be used several times to control, suppress and eliminate the PreCancerous changes. Discussed treatment course, expected reaction, and possible side effects. - Recommend daily broad spectrum sunscreen SPF 30+ to sun-exposed areas, reapply every 2 hours as needed.  - Staying in the shade or wearing long sleeves, sun  glasses (UVA+UVB protection) and wide brim hats (4-inch brim around the entire circumference of the hat) are also recommended. - Call for new or changing lesions.  - Will schedule photodynamic therapy/Red Light to the face with debridement.     Return in about 6 months (around 12/19/2022) for AK Follow Up.  I, Lawson Radar, CMA, am acting as scribe for Willeen Niece, MD.   Documentation: I have reviewed the above documentation for accuracy and completeness, and I agree with the above.  Willeen Niece, MD

## 2022-06-19 NOTE — Patient Instructions (Addendum)
Cryotherapy Aftercare  Wash gently with soap and water everyday.   Apply Vaseline Jelly daily until healed.     Finger: Start Mupirocin apply with bandage change.   Take doxycycline 100 mg tablet by mouth twice a day for 10 days. #20, 0 refills  Doxycycline should be taken with food to prevent nausea. Do not lay down for 30 minutes after taking. Be cautious with sun exposure and use good sun protection while on this medication. Pregnant women should not take this medication.      Photodynamic Therapy/Red Light Therapy  Actinic keratoses are the dry, red scaly spots on the skin caused by sun damage. A portion of these spots can turn into skin cancer with time, and treating them can help prevent development of skin cancer.   Treatment of these spots requires removal of the defective skin cells. There are various ways to remove actinic keratoses, including freezing with liquid nitrogen, treatment with creams, or treatment with a blue light procedure in the office.   Photodynamic Therapy (PDT), also known as "red light therapy" is an in office procedure used to treat actinic keratoses. It works by targeting precancerous cells. After treatment, these cells peel off and are replaced by healthy ones.   For your phototherapy appointment, you will have two appointments on the day of your treatment. The first appointment will be to apply a cream to the treatment area. You will leave this cream on for 1-2 hours depending on the area being treated. The second appointment will be to shine a blue light on the area for 16 minutes to kill off the precancer cells. It is common to experience a burning sensation during the treatment.  After your treatment, it will be important to keep the treated areas of skin out of the sun completely for 48-72 hours (2-3 days) to prevent having a reaction.   Common side effects include: - Burning or stinging, which may be severe and can last up to 24-72 hours after your  treatment - Scaling and crusting which may last up to 2 weeks - Redness, swelling and/or peeling which can last up to 4 weeks  To Care for Your Skin After PDT/Red Light Therapy: - Wash with soap, water and shampoo as normal. - If needed, you can use cold compresses (e.g. ice packs) for comfort - If okay with your primary care doctor, you may use analgesics such as acetaminophen (tylenol) every 4-6 hours, not to exceed recommended dose - You may apply Cerave Healing Ointment, Vaseline or Aquaphor as needed - If you have a lot of swelling you may take a Benadryl to help with this (this may cause drowsiness), not to exceed recommended dose. This may increase the risk of falls in people over 65 and may slow reaction time while driving, so it is not recommended to take before driving or operating machinery. - Sun Precautions - Wear a wide brim hat for the next week if outside  - Wear a sunblock with zinc or titanium dioxide at least SPF 50 daily  If you have any questions or concerns, please call the office and ask to speak with a nurse.      Due to recent changes in healthcare laws, you may see results of your pathology and/or laboratory studies on MyChart before the doctors have had a chance to review them. We understand that in some cases there may be results that are confusing or concerning to you. Please understand that not all results are received  at the same time and often the doctors may need to interpret multiple results in order to provide you with the best plan of care or course of treatment. Therefore, we ask that you please give [place] 2 business days to thoroughly review all your results before contacting the office for clarification. Should we see a critical lab result, you will be contacted sooner.   If You Need Anything After Your Visit  If you have any questions or concerns for your doctor, please call our main line at [phone] and press option 4 to reach your doctor's medical  assistant. If no one answers, please leave a voicemail as directed and we will return your call as soon as possible. Messages left after 4 pm will be answered the following business day.   You may also send [place] a message via [place]. We typically respond to MyChart messages within 1-2 business days.  For prescription refills, please ask your pharmacy to contact our office. Our fax number is [phone].  If you have an urgent issue when the clinic is closed that cannot wait until the next business day, you can page your doctor at the number below.    Please note that while we do our best to be available for urgent issues outside of office hours, we are not available 24/7.   If you have an urgent issue and are unable to reach [place], you may choose to seek medical care at your doctor's office, retail clinic, urgent care center, or emergency room.  If you have a medical emergency, please immediately call 911 or go to the emergency department.  Pager Numbers  - Dr. Nehemiah Massed: [phone]  - Dr. Laurence Ferrari: [phone]  - Dr. Nicole Kindred: [phone]  In the event of inclement weather, please call our main line at [phone] for an update on the status of any delays or closures.  Dermatology Medication Tips: Please keep the boxes that topical medications come in in order to help keep track of the instructions about where and how to use these. Pharmacies typically print the medication instructions only on the boxes and not directly on the medication tubes.   If your medication is too expensive, please contact our office at [phone] option 4 or send [place] a message through [place].   We are unable to tell what your co-pay for medications will be in advance as this is different depending on your insurance coverage. However, we may be able to find a substitute medication at lower cost or fill out paperwork to get insurance to cover a needed medication.   If a prior authorization is required to get your  medication covered by your insurance company, please allow [place] 1-2 business days to complete this process.  Drug prices often vary depending on where the prescription is filled and some pharmacies may offer cheaper prices.  The website www.goodrx.com contains coupons for medications through different pharmacies. The prices here do not account for what the cost may be with help from insurance (it may be cheaper with your insurance), but the website can give you the price if you did not use any insurance.  - You can print the associated coupon and take it with your prescription to the pharmacy.  - You may also stop by our office during regular business hours and pick up a GoodRx coupon card.  - If you need your prescription sent electronically to a different pharmacy, notify our office through [facility] or by phone at [phone] option 4.  Si Usted Necesita Algo Despus de Su Visita  Tambin puede enviarnos un mensaje a travs de Clinical cytogeneticist. Por lo general respondemos a los mensajes de MyChart en el transcurso de 1 a 2 das hbiles.  Para renovar recetas, por favor pida a su farmacia que se ponga en contacto con nuestra oficina. Annie Sable de fax es [place] [phone].  Si tiene un asunto urgente cuando la clnica est cerrada y que no puede esperar hasta el siguiente da hbil, puede llamar/localizar a su doctor(a) al nmero que aparece a continuacin.   Por favor, tenga en cuenta que aunque hacemos todo lo posible para estar disponibles para asuntos urgentes fuera del horario de [place], no estamos disponibles las 24 horas del da, los 7 [address]  [address] de la [place].   Si tiene un problema urgente y no puede comunicarse con nosotros, puede optar por buscar atencin mdica  en el consultorio de su doctor(a), en una clnica privada, en un centro de atencin urgente o en una sala de emergencias.  Si tiene Engineer, drilling, por favor llame inmediatamente al 911 o vaya a la sala de  emergencias.  Nmeros de bper  - Dr. Gwen Pounds: [phone]  - Dra. Moye: [phone]  - Dra. Roseanne Reno: [phone]  En caso de inclemencias del [place], por favor llame a Lacy Duverney principal al [phone] para una actualizacin sobre el [place] de cualquier retraso o cierre.  Consejos para la medicacin en dermatologa: Por favor, guarde las cajas en las que vienen los medicamentos de uso tpico para ayudarle a seguir las instrucciones sobre dnde y cmo usarlos. Las farmacias generalmente imprimen las instrucciones del medicamento slo en las cajas y no directamente en los tubos del [place].   Si su medicamento es muy caro, por favor, pngase en contacto con Rolm [place]ala llamando al [phone] y presione la opcin 4 o envenos un mensaje a travs de Clinical cytogeneticist.   No podemos decirle cul ser su copago por los medicamentos por adelantado ya que esto es diferente dependiendo de la cobertura de su seguro. Sin embargo, es posible que podamos encontrar un medicamento sustituto a Audiological scientist un formulario para que el seguro cubra el medicamento que se considera necesario.   Si se requiere una autorizacin previa para que su compaa de seguros [place] su medicamento, por favor permtanos de 1 a 2 das hbiles para completar [address]  Los precios de los medicamentos varan con frecuencia dependiendo del Environmental consultant de dnde se surte la receta y alguna farmacias pueden ofrecer precios ms baratos.  El sitio web www.goodrx.com tiene cupones para medicamentos de Health and safety inspector. Los precios aqu no tienen en cuenta lo que podra costar con la ayuda del seguro (puede ser ms barato con su seguro), pero el sitio web puede darle el precio si no utiliz Tourist information centre manager.  - Puede imprimir el cupn correspondiente y llevarlo con su receta a la farmacia.  - Tambin puede pasar por nuestra oficina durante el horario de atencin regular y Education officer, museum una tarjeta de cupones de GoodRx.  - Si  necesita que su receta se enve electrnicamente a una farmacia diferente, informe a nuestra oficina a travs de MyChart de [place] o por telfono llamando al [phone] y presione la opcin 4.

## 2022-06-27 ENCOUNTER — Telehealth: Payer: Self-pay

## 2022-06-27 NOTE — Telephone Encounter (Signed)
Recommend urgent care if worsens over the weekend.

## 2022-06-27 NOTE — Telephone Encounter (Signed)
Patient called stating her finger is still tender but not as red as before. She asked if we needed to do a different treatment. She is using mupirocin daily and taking Doxycycline. She has 2 more days on Doxy. I recommended she finish the Doxy. Discussed option of I&D of lesion. Patient deferred at this time. Advised patient digital mucous cysts can be painful and best option for treatment is a Hydrographic surveyor. She will get back with [place] Monday with status update and let us know if she would like referral to hand surgeon.

## 2022-06-29 DIAGNOSIS — M79645 Pain in left finger(s): Secondary | ICD-10-CM | POA: Diagnosis not present

## 2022-06-29 DIAGNOSIS — L989 Disorder of the skin and subcutaneous tissue, unspecified: Secondary | ICD-10-CM | POA: Diagnosis not present

## 2022-06-29 DIAGNOSIS — M67442 Ganglion, left hand: Secondary | ICD-10-CM | POA: Diagnosis not present

## 2022-06-29 DIAGNOSIS — M799 Soft tissue disorder, unspecified: Secondary | ICD-10-CM | POA: Diagnosis not present

## 2022-07-11 ENCOUNTER — Ambulatory Visit (INDEPENDENT_AMBULATORY_CARE_PROVIDER_SITE_OTHER): Payer: Medicare HMO | Admitting: Dermatology

## 2022-07-11 ENCOUNTER — Ambulatory Visit: Payer: Medicare HMO

## 2022-07-11 DIAGNOSIS — L578 Other skin changes due to chronic exposure to nonionizing radiation: Secondary | ICD-10-CM | POA: Diagnosis not present

## 2022-07-11 DIAGNOSIS — W908XXA Exposure to other nonionizing radiation, initial encounter: Secondary | ICD-10-CM | POA: Diagnosis not present

## 2022-07-11 DIAGNOSIS — D485 Neoplasm of uncertain behavior of skin: Secondary | ICD-10-CM

## 2022-07-11 DIAGNOSIS — C44629 Squamous cell carcinoma of skin of left upper limb, including shoulder: Secondary | ICD-10-CM | POA: Diagnosis not present

## 2022-07-11 MED ORDER — MUPIROCIN 2 % EX OINT: TOPICAL_OINTMENT | CUTANEOUS | 1 refills | Status: AC

## 2022-07-11 NOTE — Patient Instructions (Signed)
Wound Care Instructions  Cleanse wound gently with soap and water once a day then pat dry with clean gauze. Apply a thin coat of Petrolatum (petroleum jelly, "Vaseline") over the wound (unless you have an allergy to this). We recommend that you use a new, sterile tube of Vaseline. Do not pick or remove scabs. Do not remove the yellow or white "healing tissue" from the base of the wound.  Cover the wound with fresh, clean, nonstick gauze and secure with paper tape. You may use Band-Aids in place of gauze and tape if the wound is small enough, but would recommend trimming much of the tape off as there is often too much. Sometimes Band-Aids can irritate the skin.  You should call the office for your biopsy report after 1 week if you have not already been contacted.  If you experience any problems, such as abnormal amounts of bleeding, swelling, significant bruising, significant pain, or evidence of infection, please call the office immediately.  FOR ADULT SURGERY PATIENTS: If you need something for pain relief you may take 1 extra strength Tylenol (acetaminophen) AND 2 Ibuprofen (200mg each) together every 4 hours as needed for pain. (do not take these if you are allergic to them or if you have a reason you should not take them.) Typically, you may only need pain medication for 1 to 3 days.   Due to recent changes in healthcare laws, you may see results of your pathology and/or laboratory studies on MyChart before the doctors have had a chance to review them. We understand that in some cases there may be results that are confusing or concerning to you. Please understand that not all results are received at the same time and often the doctors may need to interpret multiple results in order to provide you with the best plan of care or course of treatment. Therefore, we ask that you please give us 2 business days to thoroughly review all your results before contacting the office for clarification. Should we  see a critical lab result, you will be contacted sooner.   If You Need Anything After Your Visit  If you have any questions or concerns for your doctor, please call our main line at [phone] and press option 4 to reach your doctor's medical assistant. If no one answers, please leave a voicemail as directed and we will return your call as soon as possible. Messages left after 4 pm will be answered the following business day.   You may also send us a message via MyChart. We typically respond to MyChart messages within 1-2 business days.  For prescription refills, please ask your pharmacy to contact our office. Our fax number is [phone].  If you have an urgent issue when the clinic is closed that cannot wait until the next business day, you can page your doctor at the number below.    Please note that while we do our best to be available for urgent issues outside of office hours, we are not available 24/7.   If you have an urgent issue and are unable to reach us, you may choose to seek medical care at your doctor's office, retail clinic, urgent care center, or emergency room.  If you have a medical emergency, please immediately call 911 or go to the emergency department.  Pager Numbers  - Dr. Kowalski: [phone]  - Dr. Moye: [phone]  - Dr. Stewart: [phone]  In the event of inclement weather, please call our main line at [phone] for   an update on the status of any delays or closures.  Dermatology Medication Tips: Please keep the boxes that topical medications come in in order to help keep track of the instructions about where and how to use these. Pharmacies typically print the medication instructions only on the boxes and not directly on the medication tubes.   If your medication is too expensive, please contact our office at [phone] option 4 or send us a message through MyChart.   We are unable to tell what your co-pay for medications will be in advance  as this is different depending on your insurance coverage. However, we may be able to find a substitute medication at lower cost or fill out paperwork to get insurance to cover a needed medication.   If a prior authorization is required to get your medication covered by your insurance company, please allow us 1-2 business days to complete this process.  Drug prices often vary depending on where the prescription is filled and some pharmacies may offer cheaper prices.  The website www.goodrx.com contains coupons for medications through different pharmacies. The prices here do not account for what the cost may be with help from insurance (it may be cheaper with your insurance), but the website can give you the price if you did not use any insurance.  - You can print the associated coupon and take it with your prescription to the pharmacy.  - You may also stop by our office during regular business hours and pick up a GoodRx coupon card.  - If you need your prescription sent electronically to a different pharmacy, notify our office through [place] MyChart or by phone at [phone] option 4.      

## 2022-07-11 NOTE — Progress Notes (Signed)
   Follow-Up Visit   Subjective  Robin Roberts is a 89 y.o. female who presents for the following: inflamed myxoid cyst at left pinky finger. Patient was given doxycycline 100 mg bid x 10 days and mupirocin 6/18 by our office. It did not improve and got bigger and more painful, so patient went to [facility] and was given 5 days of prednisone and pain meds on 6/28 but cyst has continued to worsen.    The following portions of the chart were reviewed this encounter and updated as appropriate: medications, allergies, medical history  Review of Systems:  No other skin or systemic complaints except as noted in HPI or Assessment and Plan.  Objective  Well appearing patient in no apparent distress; mood and affect are within normal limits.   A focused examination was performed of the following areas: Left hand and fingers  Relevant exam findings are noted in the Assessment and Plan.  left 5th DIP 1.1 cm tender pink yellow nodule          Assessment & Plan     Neoplasm of uncertain behavior of skin left 5th DIP  Epidermal / dermal shaving  Lesion diameter (cm):  1.1 Informed consent: discussed and consent obtained   Patient was prepped and draped in usual sterile fashion: area prepped with alcohol. Anesthesia: the lesion was anesthetized in a standard fashion   Anesthetic:  1% lidocaine w/ epinephrine 1-100,000 buffered w/ 8.4% NaHCO3 Instrument used: DermaBlade   Hemostasis achieved with: pressure, aluminum chloride and electrodesiccation   Outcome: patient tolerated procedure well   Post-procedure details: wound care instructions given   Post-procedure details comment:  Ointment and small bandage applied  Specimen 1 - Surgical pathology Differential Diagnosis: Cyst r/o SCC  Check Margins: No 1.1 cm tender pink yellow nodule  Extremely painful for patient- Shave removal/biopsy today, Not clinically c/w cyst, lesion is solid and c/w tumor. Not able to  treat base today with EDC due to depth of tumor and location over joint. Will plan to do URGENT referral for Mohs with Dr. Adriana Simas at [facility] pending bx results.  Actinic skin damage   ACTINIC DAMAGE - chronic, secondary to cumulative UV radiation exposure/sun exposure over time - diffuse scaly erythematous macules with underlying dyspigmentation - Recommend daily broad spectrum sunscreen SPF 30+ to sun-exposed areas, reapply every 2 hours as needed.  - Recommend staying in the shade or wearing long sleeves, sun glasses (UVA+UVB protection) and wide brim hats (4-inch brim around the entire circumference of the hat). - Call for new or changing lesions.   Return for as scheduled.  Anise Salvo, RMA, am acting as scribe for Willeen Niece, MD .   Documentation: I have reviewed the above documentation for accuracy and completeness, and I agree with the above.  Willeen Niece, MD

## 2022-07-18 ENCOUNTER — Telehealth: Payer: Self-pay

## 2022-07-18 DIAGNOSIS — C4492 Squamous cell carcinoma of skin, unspecified: Secondary | ICD-10-CM

## 2022-07-18 NOTE — Telephone Encounter (Signed)
Advised pt of bx results. Referral sent to [facility] for mohs.Robin Roberts

## 2022-07-18 NOTE — Telephone Encounter (Signed)
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  Recommend Mohs surgery ASAP since it is large and extremely painful for patient.  [place], [place], or [place], depending on pt preference and availability.  - please call patient

## 2022-07-31 DIAGNOSIS — I10 Essential (primary) hypertension: Secondary | ICD-10-CM | POA: Diagnosis not present

## 2022-08-07 DIAGNOSIS — E785 Hyperlipidemia, unspecified: Secondary | ICD-10-CM | POA: Diagnosis not present

## 2022-08-07 DIAGNOSIS — I2510 Atherosclerotic heart disease of native coronary artery without angina pectoris: Secondary | ICD-10-CM | POA: Diagnosis not present

## 2022-08-07 DIAGNOSIS — I10 Essential (primary) hypertension: Secondary | ICD-10-CM | POA: Diagnosis not present

## 2022-08-07 DIAGNOSIS — R634 Abnormal weight loss: Secondary | ICD-10-CM | POA: Diagnosis not present

## 2022-08-07 DIAGNOSIS — K219 Gastro-esophageal reflux disease without esophagitis: Secondary | ICD-10-CM | POA: Diagnosis not present

## 2022-08-30 DIAGNOSIS — C44629 Squamous cell carcinoma of skin of left upper limb, including shoulder: Secondary | ICD-10-CM | POA: Diagnosis not present

## 2022-08-30 DIAGNOSIS — L578 Other skin changes due to chronic exposure to nonionizing radiation: Secondary | ICD-10-CM | POA: Diagnosis not present

## 2022-08-30 DIAGNOSIS — L814 Other melanin hyperpigmentation: Secondary | ICD-10-CM | POA: Diagnosis not present

## 2022-08-30 DIAGNOSIS — L988 Other specified disorders of the skin and subcutaneous tissue: Secondary | ICD-10-CM | POA: Diagnosis not present

## 2022-12-18 ENCOUNTER — Encounter: Payer: Self-pay | Admitting: Dermatology

## 2022-12-18 ENCOUNTER — Ambulatory Visit: Payer: Medicare HMO | Admitting: Dermatology

## 2022-12-18 DIAGNOSIS — L570 Actinic keratosis: Secondary | ICD-10-CM

## 2022-12-18 DIAGNOSIS — Z85828 Personal history of other malignant neoplasm of skin: Secondary | ICD-10-CM | POA: Diagnosis not present

## 2022-12-18 DIAGNOSIS — L578 Other skin changes due to chronic exposure to nonionizing radiation: Secondary | ICD-10-CM

## 2022-12-18 DIAGNOSIS — W908XXA Exposure to other nonionizing radiation, initial encounter: Secondary | ICD-10-CM | POA: Diagnosis not present

## 2022-12-18 DIAGNOSIS — Z7189 Other specified counseling: Secondary | ICD-10-CM | POA: Diagnosis not present

## 2022-12-18 NOTE — Patient Instructions (Addendum)
Cryotherapy Aftercare  Wash gently with soap and water everyday.   Apply Vaseline Jelly daily until healed.    Recommend daily broad spectrum sunscreen SPF 30+ to sun-exposed areas, reapply every 2 hours as needed. Call for new or changing lesions.  Staying in the shade or wearing long sleeves, sun glasses (UVA+UVB protection) and wide brim hats (4-inch brim around the entire circumference of the hat) are also recommended for sun protection.     Photodynamic Therapy- "Blue or Red Light Therapy"  Actinic keratoses are the dry, red scaly spots on the skin caused by sun damage. A portion of these spots can turn into skin cancer with time, and treating them can help prevent development of skin cancer.   Treatment of these spots requires removal of the defective skin cells. There are various ways to remove actinic keratoses, including freezing with liquid nitrogen, treatment with creams, or treatment with a blue light procedure in the office.   Photodynamic Therapy (PDT), also known as "blue or red light therapy" is an in office procedure used to treat actinic keratoses. It works by targeting precancerous cells. After treatment, these cells peel off and are replaced by healthy ones.   For your phototherapy appointment, you will have two appointments on the day of your treatment. The first appointment will be to apply a cream to the treatment area. You will leave this cream on for 1-2 hours depending on the area being treated. The second appointment will be to shine a blue or red light on the area for 16-20 minutes to kill off the precancer cells. It is common to experience a burning sensation during the treatment.  After your treatment, it will be important to keep the treated areas of skin out of the sun completely for 48-72 hours (2-3 days) to prevent having a reaction.   Common side effects include: - Burning or stinging, which may be severe and can last up to 24-72 hours after your  treatment - Scaling and crusting which may last up to 2 weeks - Redness, swelling and/or peeling which can last up to 4 weeks  To Care for Your Skin After PDT/Blue/Red Light Therapy: - Wash with soap, water and shampoo as normal. - If needed, you can use cold compresses (e.g. ice packs) for comfort - If okay with your primary care doctor, you may use analgesics such as acetaminophen (tylenol) every 4-6 hours, not to exceed recommended dose - You may apply Cerave Healing Ointment, Vaseline or Aquaphor as needed - If you have a lot of swelling you may take a Benadryl to help with this (this may cause drowsiness), not to exceed recommended dose. This may increase the risk of falls in people over 65 and may slow reaction time while driving, so it is not recommended to take before driving or operating machinery. - Sun Precautions - Wear a wide brim hat for the next week if outside  - Wear a sunblock with zinc or titanium dioxide at least SPF 50 daily  If you have any questions or concerns, please call the office and ask to speak with a nurse.      Due to recent changes in healthcare laws, you may see results of your pathology and/or laboratory studies on MyChart before the doctors have had a chance to review them. We understand that in some cases there may be results that are confusing or concerning to you. Please understand that not all results are received at the same time and often  the doctors may need to interpret multiple results in order to provide you with the best plan of care or course of treatment. Therefore, we ask that you please give [place] 2 business days to thoroughly review all your results before contacting the office for clarification. Should we see a critical lab result, you will be contacted sooner.   If You Need Anything After Your Visit  If you have any questions or concerns for your doctor, please call our main line at [phone] and press option 4 to reach your doctor's  medical assistant. If no one answers, please leave a voicemail as directed and we will return your call as soon as possible. Messages left after 4 pm will be answered the following business day.   You may also send [place] a message via MyChart. We typically respond to MyChart messages within 1-2 business days.  For prescription refills, please ask your pharmacy to contact our office. Our fax number is [phone].  If you have an urgent issue when the clinic is closed that cannot wait until the next business day, you can page your doctor at the number below.    Please note that while we do our best to be available for urgent issues outside of office hours, we are not available 24/7.   If you have an urgent issue and are unable to reach [place], you may choose to seek medical care at your doctor's office, retail clinic, urgent care center, or emergency room.  If you have a medical emergency, please immediately call 911 or go to the emergency department.  Pager Numbers  - Dr. Gwen Pounds: [phone]  - Dr. Roseanne Reno: [phone]  - Dr. Katrinka Blazing: [phone]   In the event of inclement weather, please call our main line at [phone] for an update on the status of any delays or closures.  Dermatology Medication Tips: Please keep the boxes that topical medications come in in order to help keep track of the instructions about where and how to use these. Pharmacies typically print the medication instructions only on the boxes and not directly on the medication tubes.   If your medication is too expensive, please contact our office at [phone] option 4 or send [place] a message through MyChart.   We are unable to tell what your co-pay for medications will be in advance as this is different depending on your insurance coverage. However, we may be able to find a substitute medication at lower cost or fill out paperwork to get insurance to cover a needed medication.   If a prior authorization is required  to get your medication covered by your insurance company, please allow [place] 1-2 business days to complete this process.  Drug prices often vary depending on where the prescription is filled and some pharmacies may offer cheaper prices.  The website www.goodrx.com contains coupons for medications through different pharmacies. The prices here do not account for what the cost may be with help from insurance (it may be cheaper with your insurance), but the website can give you the price if you did not use any insurance.  - You can print the associated coupon and take it with your prescription to the pharmacy.  - You may also stop by our office during regular business hours and pick up a GoodRx coupon card.  - If you need your prescription sent electronically to a different pharmacy, notify our office through [facility] or by phone at [phone] option 4.     Si Usted  Necesita Algo Despus de Su Visita  Tambin puede enviarnos un mensaje a travs de [place]. Por lo general respondemos a los mensajes de MyChart en el transcurso de 1 a 2 das hbiles.  Para renovar recetas, por favor pida a su farmacia que se ponga en contacto con nuestra oficina. Annie Sable de fax es [place] [phone].  Si tiene un asunto urgente cuando la clnica est cerrada y que no puede esperar hasta el siguiente da hbil, puede llamar/localizar a su doctor(a) al nmero que aparece a continuacin.   Por favor, tenga en cuenta que aunque hacemos todo lo posible para estar disponibles para asuntos urgentes fuera del horario de [place], no estamos disponibles las 24 horas del da, los 7 [address]  [address] de la [place].   Si tiene un problema urgente y no puede comunicarse con nosotros, puede optar por buscar atencin mdica  en el consultorio de su doctor(a), en una clnica privada, en un centro de atencin urgente o en una sala de emergencias.  Si tiene Engineer, drilling, por favor llame inmediatamente al 911 o vaya a la sala  de emergencias.  Nmeros de bper  - Dr. Gwen Pounds: [phone]  - Dra. Roseanne Reno: [phone]  - Dr. Katrinka Blazing: [phone]   En caso de inclemencias del tiempo, por favor llame a Lacy Duverney principal al [phone] para una actualizacin sobre el [place] de cualquier retraso o cierre.  Consejos para la medicacin en dermatologa: Por favor, guarde las cajas en las que vienen los medicamentos de uso tpico para ayudarle a seguir las instrucciones sobre dnde y cmo usarlos. Las farmacias generalmente imprimen las instrucciones del medicamento slo en las cajas y no directamente en los tubos del [place].   Si su medicamento es muy caro, por favor, pngase en contacto con Rolm Gala llamando al [phone] y presione la opcin 4 o envenos un mensaje a travs de Clinical cytogeneticist.   No podemos decirle cul ser su copago por los medicamentos por adelantado ya que esto es diferente dependiendo de la cobertura de su seguro. Sin embargo, es posible que podamos encontrar un medicamento sustituto a Audiological scientist un formulario para que el seguro cubra el medicamento que se considera necesario.   Si se requiere una autorizacin previa para que su compaa de seguros [place] su medicamento, por favor permtanos de 1 a 2 das hbiles para completar [address]  Los precios de los medicamentos varan con frecuencia dependiendo del Environmental consultant de dnde se surte la receta y alguna farmacias pueden ofrecer precios ms baratos.  El sitio web www.goodrx.com tiene cupones para medicamentos de Health and safety inspector. Los precios aqu no tienen en cuenta lo que podra costar con la ayuda del seguro (puede ser ms barato con su seguro), pero el sitio web puede darle el precio si no utiliz Tourist information centre manager.  - Puede imprimir el cupn correspondiente y llevarlo con su receta a la farmacia.  - Tambin puede pasar por nuestra oficina durante el horario de atencin regular y Education officer, museum una tarjeta de cupones de GoodRx.  -  Si necesita que su receta se enve electrnicamente a una farmacia diferente, informe a nuestra oficina a travs de MyChart de [place] o por telfono llamando al [phone] y presione la opcin 4.

## 2022-12-18 NOTE — Progress Notes (Signed)
Follow-Up Visit   Subjective  Robin Roberts is a 89 y.o. female who presents for the following: AK follow up. Tx 06/19/2022 with LN2. Left medial cheek, right upper eyebrow, left nasal ala.   Recheck SCC site. L-5 DIP. Mohs at [facility] 08/30/2022. States has been healing well.   The patient has spots, moles and lesions to be evaluated, some may be new or changing and the patient may have concern these could be cancer.    The following portions of the chart were reviewed this encounter and updated as appropriate: medications, allergies, medical history  Review of Systems:  No other skin or systemic complaints except as noted in HPI or Assessment and Plan.  Objective  Well appearing patient in no apparent distress; mood and affect are within normal limits.  A focused examination was performed of the following areas: Face, hands Relevant physical exam findings are noted in the Assessment and Plan.  Left Zygoma x1, nasal dorsum x1 (2) Erythematous thin papules/macules with gritty scale.   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 Mohs at [facility] 08/30/2022. - No evidence of recurrence today - Recommend regular full body skin exams - Recommend daily broad spectrum sunscreen SPF 30+ to sun-exposed areas, reapply every 2 hours as needed.  - Call if any new or changing lesions are noted between office visits  ACTINIC DAMAGE WITH PRECANCEROUS ACTINIC KERATOSES Counseling for Topical Chemotherapy Management: Patient exhibits: - Severe, confluent actinic changes with pre-cancerous actinic keratoses that is secondary to cumulative UV radiation exposure over time - Condition that is severe; chronic, not at goal. - diffuse scaly erythematous macules and papules with underlying dyspigmentation - Discussed Prescription "Field Treatment" topical Chemotherapy for Severe, Chronic Confluent Actinic Changes with Pre-Cancerous Actinic Keratoses Field treatment involves  treatment of an entire area of skin that has confluent Actinic Changes (Sun/ Ultraviolet light damage) and PreCancerous Actinic Keratoses by method of PhotoDynamic Therapy (PDT) and/or prescription Topical Chemotherapy agents such as 5-fluorouracil, 5-fluorouracil/calcipotriene, and/or imiquimod.  The purpose is to decrease the number of clinically evident and subclinical PreCancerous lesions to prevent progression to development of skin cancer by chemically destroying early precancer changes that may or may not be visible.  It has been shown to reduce the risk of developing skin cancer in the treated area. As a result of treatment, redness, scaling, crusting, and open sores may occur during treatment course. One or more than one of these methods may be used and may have to be used several times to control, suppress and eliminate the PreCancerous changes. Discussed treatment course, expected reaction, and possible side effects. - Recommend daily broad spectrum sunscreen SPF 30+ to sun-exposed areas, reapply every 2 hours as needed.  - Staying in the shade or wearing long sleeves, sun glasses (UVA+UVB protection) and wide brim hats (4-inch brim around the entire circumference of the hat) are also recommended. - Call for new or changing lesions.  - Will schedule photodynamic therapy/Red Light to the face with debridement.     AK (ACTINIC KERATOSIS) (2) Left Zygoma x1, nasal dorsum x1 (2) Actinic keratoses are precancerous spots that appear secondary to cumulative UV radiation exposure/sun exposure over time. They are chronic with expected duration over 1 year. A portion of actinic keratoses will progress to squamous cell carcinoma of the skin. It is not possible to reliably predict which spots will progress to skin cancer and so treatment is recommended to prevent development of skin cancer.  Recommend daily broad spectrum sunscreen SPF 30+ to sun-exposed areas, reapply every 2 hours as needed.  Recommend  staying in the shade or wearing long sleeves, sun glasses (UVA+UVB protection) and wide brim hats (4-inch brim around the entire circumference of the hat). Call for new or changing lesions. Destruction of lesion - Left Zygoma x1, nasal dorsum x1 (2)  Destruction method: cryotherapy   Informed consent: discussed and consent obtained   Lesion destroyed using liquid nitrogen: Yes   Region frozen until ice ball extended beyond lesion: Yes   Outcome: patient tolerated procedure well with no complications   Post-procedure details: wound care instructions given   Additional details:  Prior to procedure, discussed risks of blister formation, small wound, skin dyspigmentation, or rare scar following cryotherapy. Recommend Vaseline ointment to treated areas while healing.    Return in about 6 months (around 06/18/2023) for AK Follow Up, TBSE, PDT Next Available.  I, Lawson Radar, CMA, am acting as scribe for Willeen Niece, MD.   Documentation: I have reviewed the above documentation for accuracy and completeness, and I agree with the above.  Willeen Niece, MD

## 2023-01-23 ENCOUNTER — Ambulatory Visit: Payer: Medicare HMO

## 2023-02-06 DIAGNOSIS — E785 Hyperlipidemia, unspecified: Secondary | ICD-10-CM | POA: Diagnosis not present

## 2023-02-06 DIAGNOSIS — I10 Essential (primary) hypertension: Secondary | ICD-10-CM | POA: Diagnosis not present

## 2023-02-12 ENCOUNTER — Ambulatory Visit: Payer: Medicare HMO

## 2023-02-13 ENCOUNTER — Emergency Department
Admission: EM | Admit: 2023-02-13 | Discharge: 2023-02-13 | Disposition: A | Discharge status: Home / Self Care | Payer: Medicare HMO | Attending: Emergency Medicine | Admitting: Emergency Medicine

## 2023-02-13 ENCOUNTER — Other Ambulatory Visit: Payer: Self-pay

## 2023-02-13 ENCOUNTER — Encounter: Payer: Self-pay | Admitting: Emergency Medicine

## 2023-02-13 ENCOUNTER — Emergency Department: Payer: Medicare HMO

## 2023-02-13 DIAGNOSIS — W01198A Fall on same level from slipping, tripping and stumbling with subsequent striking against other object, initial encounter: Secondary | ICD-10-CM | POA: Insufficient documentation

## 2023-02-13 DIAGNOSIS — S0990XA Unspecified injury of head, initial encounter: Secondary | ICD-10-CM | POA: Insufficient documentation

## 2023-02-13 DIAGNOSIS — R9082 White matter disease, unspecified: Secondary | ICD-10-CM | POA: Diagnosis not present

## 2023-02-13 DIAGNOSIS — Z043 Encounter for examination and observation following other accident: Secondary | ICD-10-CM | POA: Diagnosis not present

## 2023-02-13 DIAGNOSIS — W19XXXA Unspecified fall, initial encounter: Secondary | ICD-10-CM

## 2023-02-13 DIAGNOSIS — R42 Dizziness and giddiness: Secondary | ICD-10-CM | POA: Diagnosis not present

## 2023-02-13 DIAGNOSIS — Y92002 Bathroom of unspecified non-institutional (private) residence single-family (private) house as the place of occurrence of the external cause: Secondary | ICD-10-CM | POA: Diagnosis not present

## 2023-02-13 DIAGNOSIS — Z03818 Encounter for observation for suspected exposure to other biological agents ruled out: Secondary | ICD-10-CM | POA: Diagnosis not present

## 2023-02-13 DIAGNOSIS — J3489 Other specified disorders of nose and nasal sinuses: Secondary | ICD-10-CM | POA: Diagnosis not present

## 2023-02-13 DIAGNOSIS — R000 Tachycardia, unspecified: Secondary | ICD-10-CM | POA: Diagnosis not present

## 2023-02-13 DIAGNOSIS — Z0000 Encounter for general adult medical examination without abnormal findings: Secondary | ICD-10-CM | POA: Diagnosis not present

## 2023-02-13 DIAGNOSIS — N1832 Chronic kidney disease, stage 3b: Secondary | ICD-10-CM | POA: Diagnosis not present

## 2023-02-13 DIAGNOSIS — Z23 Encounter for immunization: Secondary | ICD-10-CM | POA: Diagnosis not present

## 2023-02-13 DIAGNOSIS — I10 Essential (primary) hypertension: Secondary | ICD-10-CM | POA: Diagnosis not present

## 2023-02-13 DIAGNOSIS — I129 Hypertensive chronic kidney disease with stage 1 through stage 4 chronic kidney disease, or unspecified chronic kidney disease: Secondary | ICD-10-CM | POA: Diagnosis not present

## 2023-02-13 NOTE — ED Provider Notes (Signed)
[facility] Provider Note    Event Date/Time   First MD Initiated Contact with Patient 02/13/23 1649     (approximate)   History   Fall   HPI  Robin Roberts is a 89 y.o. female presents the ER via EMS from [facility] for a fall that occurred 1 week ago.  Patient states that she had just used the bathroom and leaned forward to pull her pants up when she lost her balance and fell hitting her head.  No LOC.  States had a little blood on her pillow for couple days and then went away.  States has not been dizzy since she had the fall.  She has had no neck pain.  No chest pain or shortness of breath.  She states does not really understand why [facility] clinic sent her here.  And would like to leave because her car is at [facility].  Patient does still live alone and is self-sufficient.      Physical Exam   Triage Vital Signs: ED Triage Vitals [02/13/23 1616]  Encounter Vitals Group     BP 133/69     Systolic BP Percentile      Diastolic BP Percentile      Pulse Rate (!) 103     Resp 18     Temp 98.3 F (36.8 C)     Temp Source Oral     SpO2 97 %     Weight 154 lb 5.2 oz (70 kg)     Height 5\' 5"  (1.651 m)     Head Circumference      Peak Flow      Pain Score 0     Pain Loc      Pain Education      Exclude from Growth Chart     Most recent vital signs: Vitals:   02/13/23 1616  BP: 133/69  Pulse: (!) 103  Resp: 18  Temp: 98.3 F (36.8 C)  SpO2: 97%     General: Awake, no distress.   CV:  Good peripheral perfusion. regular rate and  rhythm Resp:  Normal effort. Lungs cta Abd:  No distention.   Other:  Skull nontender, no bruising noted, C-spine nontender, cranial nerves II through XII grossly intact, patient appears well   ED Results / Procedures / Treatments   Labs (all labs ordered are listed, but only abnormal results are displayed) Labs Reviewed - No data to display   EKG     RADIOLOGY CT of the  head    PROCEDURES:   Procedures Chief Complaint  Patient presents with   Fall      MEDICATIONS ORDERED IN ED: Medications - No data to display   IMPRESSION / MDM / ASSESSMENT AND PLAN / ED COURSE  I reviewed the triage vital signs and the nursing notes.                              Differential diagnosis includes, but is not limited to, contusion, fall, subdural, CVA, SAH  Patient's presentation is most consistent with acute illness / injury with system symptoms.   CT of the head independently reviewed interpreted by me as being negative for acute abnormality  I did explain findings to patient.  Her brother is here to take her home.  She is in agreement treatment plan.  She states still feels well.  Discharged stable condition.  Strict  instructions to return if worse      FINAL CLINICAL IMPRESSION(S) / ED DIAGNOSES   Final diagnoses:  Fall, initial encounter  Minor head injury, initial encounter     Rx / DC Orders   ED Discharge Orders     None        Note:  This document was prepared using Dragon voice recognition software and may include unintentional dictation errors.    Faythe Ghee, PA-C 02/13/23 1837    Concha Se, MD 02/13/23 [phone]

## 2023-02-13 NOTE — ED Triage Notes (Signed)
Patient to ED via [facility] from [facility] for a fall that occurred 1 week ago. PT states she tripped getting out the shower 1 week ago hitting her head. Denies LOC but does take blood thinners. States there was a knot on the back of her head but is now gone. Having intermittent dizziness since fall.

## 2023-02-14 DIAGNOSIS — I129 Hypertensive chronic kidney disease with stage 1 through stage 4 chronic kidney disease, or unspecified chronic kidney disease: Secondary | ICD-10-CM | POA: Diagnosis not present

## 2023-02-14 DIAGNOSIS — R000 Tachycardia, unspecified: Secondary | ICD-10-CM | POA: Diagnosis not present

## 2023-02-14 DIAGNOSIS — R42 Dizziness and giddiness: Secondary | ICD-10-CM | POA: Diagnosis not present

## 2023-02-14 DIAGNOSIS — Z23 Encounter for immunization: Secondary | ICD-10-CM | POA: Diagnosis not present

## 2023-02-14 DIAGNOSIS — I10 Essential (primary) hypertension: Secondary | ICD-10-CM | POA: Diagnosis not present

## 2023-02-14 DIAGNOSIS — N1832 Chronic kidney disease, stage 3b: Secondary | ICD-10-CM | POA: Diagnosis not present

## 2023-02-14 DIAGNOSIS — Z0000 Encounter for general adult medical examination without abnormal findings: Secondary | ICD-10-CM | POA: Diagnosis not present

## 2023-02-14 DIAGNOSIS — Z03818 Encounter for observation for suspected exposure to other biological agents ruled out: Secondary | ICD-10-CM | POA: Diagnosis not present

## 2023-02-14 DIAGNOSIS — J3489 Other specified disorders of nose and nasal sinuses: Secondary | ICD-10-CM | POA: Diagnosis not present

## 2023-05-01 ENCOUNTER — Encounter: Payer: Self-pay | Admitting: Medical Oncology

## 2023-05-01 ENCOUNTER — Emergency Department

## 2023-05-01 ENCOUNTER — Inpatient Hospital Stay
Admit: 2023-05-01 | Discharge: 2023-05-01 | Disposition: A | Discharge status: Home / Self Care | Attending: Internal Medicine

## 2023-05-01 ENCOUNTER — Inpatient Hospital Stay
Admission: EM | Admit: 2023-05-01 | Discharge: 2023-05-05 | DRG: 480 | Disposition: A | Discharge status: Home Health | Attending: Student | Admitting: Student

## 2023-05-01 ENCOUNTER — Other Ambulatory Visit: Payer: Self-pay

## 2023-05-01 DIAGNOSIS — Z803 Family history of malignant neoplasm of breast: Secondary | ICD-10-CM

## 2023-05-01 DIAGNOSIS — Y92009 Unspecified place in unspecified non-institutional (private) residence as the place of occurrence of the external cause: Secondary | ICD-10-CM

## 2023-05-01 DIAGNOSIS — Z79899 Other long term (current) drug therapy: Secondary | ICD-10-CM

## 2023-05-01 DIAGNOSIS — I129 Hypertensive chronic kidney disease with stage 1 through stage 4 chronic kidney disease, or unspecified chronic kidney disease: Secondary | ICD-10-CM | POA: Diagnosis not present

## 2023-05-01 DIAGNOSIS — K5730 Diverticulosis of large intestine without perforation or abscess without bleeding: Secondary | ICD-10-CM | POA: Diagnosis not present

## 2023-05-01 DIAGNOSIS — Z853 Personal history of malignant neoplasm of breast: Secondary | ICD-10-CM

## 2023-05-01 DIAGNOSIS — S72002A Fracture of unspecified part of neck of left femur, initial encounter for closed fracture: Secondary | ICD-10-CM | POA: Diagnosis not present

## 2023-05-01 DIAGNOSIS — Z85828 Personal history of other malignant neoplasm of skin: Secondary | ICD-10-CM | POA: Diagnosis not present

## 2023-05-01 DIAGNOSIS — I10 Essential (primary) hypertension: Secondary | ICD-10-CM | POA: Diagnosis present

## 2023-05-01 DIAGNOSIS — Z923 Personal history of irradiation: Secondary | ICD-10-CM

## 2023-05-01 DIAGNOSIS — R350 Frequency of micturition: Secondary | ICD-10-CM | POA: Diagnosis present

## 2023-05-01 DIAGNOSIS — E43 Unspecified severe protein-calorie malnutrition: Secondary | ICD-10-CM | POA: Insufficient documentation

## 2023-05-01 DIAGNOSIS — E876 Hypokalemia: Secondary | ICD-10-CM | POA: Diagnosis present

## 2023-05-01 DIAGNOSIS — E875 Hyperkalemia: Secondary | ICD-10-CM | POA: Diagnosis not present

## 2023-05-01 DIAGNOSIS — W19XXXA Unspecified fall, initial encounter: Secondary | ICD-10-CM

## 2023-05-01 DIAGNOSIS — E785 Hyperlipidemia, unspecified: Secondary | ICD-10-CM | POA: Diagnosis not present

## 2023-05-01 DIAGNOSIS — S72012A Unspecified intracapsular fracture of left femur, initial encounter for closed fracture: Principal | ICD-10-CM | POA: Diagnosis present

## 2023-05-01 DIAGNOSIS — Z66 Do not resuscitate: Secondary | ICD-10-CM | POA: Diagnosis present

## 2023-05-01 DIAGNOSIS — K219 Gastro-esophageal reflux disease without esophagitis: Secondary | ICD-10-CM | POA: Diagnosis present

## 2023-05-01 DIAGNOSIS — N1832 Chronic kidney disease, stage 3b: Secondary | ICD-10-CM | POA: Diagnosis not present

## 2023-05-01 DIAGNOSIS — Z043 Encounter for examination and observation following other accident: Secondary | ICD-10-CM | POA: Diagnosis not present

## 2023-05-01 DIAGNOSIS — S72009A Fracture of unspecified part of neck of unspecified femur, initial encounter for closed fracture: Secondary | ICD-10-CM | POA: Diagnosis present

## 2023-05-01 DIAGNOSIS — W010XXA Fall on same level from slipping, tripping and stumbling without subsequent striking against object, initial encounter: Secondary | ICD-10-CM | POA: Diagnosis present

## 2023-05-01 DIAGNOSIS — I700 Atherosclerosis of aorta: Secondary | ICD-10-CM | POA: Diagnosis not present

## 2023-05-01 DIAGNOSIS — Z01810 Encounter for preprocedural cardiovascular examination: Secondary | ICD-10-CM | POA: Diagnosis not present

## 2023-05-01 DIAGNOSIS — M810 Age-related osteoporosis without current pathological fracture: Secondary | ICD-10-CM | POA: Diagnosis not present

## 2023-05-01 DIAGNOSIS — S72042A Displaced fracture of base of neck of left femur, initial encounter for closed fracture: Secondary | ICD-10-CM | POA: Diagnosis not present

## 2023-05-01 DIAGNOSIS — Z6824 Body mass index (BMI) 24.0-24.9, adult: Secondary | ICD-10-CM

## 2023-05-01 DIAGNOSIS — Z880 Allergy status to penicillin: Secondary | ICD-10-CM

## 2023-05-01 DIAGNOSIS — I2510 Atherosclerotic heart disease of native coronary artery without angina pectoris: Secondary | ICD-10-CM | POA: Diagnosis present

## 2023-05-01 LAB — PROTIME-INR
INR: 1.1 (ref 0.8–1.2)
Prothrombin Time: 14.7 s (ref 11.4–15.2)

## 2023-05-01 LAB — CBC WITH DIFFERENTIAL/PLATELET
Abs Immature Granulocytes: 0.05 10*3/uL (ref 0.00–0.07)
Basophils Absolute: 0 10*3/uL (ref 0.0–0.1)
Basophils Relative: 0 %
Eosinophils Absolute: 0.1 10*3/uL (ref 0.0–0.5)
Eosinophils Relative: 1 %
HCT: 37.8 % (ref 36.0–46.0)
Hemoglobin: 12.5 g/dL (ref 12.0–15.0)
Immature Granulocytes: 1 %
Lymphocytes Relative: 5 %
Lymphs Abs: 0.4 10*3/uL — ABNORMAL LOW (ref 0.7–4.0)
MCH: 29.3 pg (ref 26.0–34.0)
MCHC: 33.1 g/dL (ref 30.0–36.0)
MCV: 88.7 fL (ref 80.0–100.0)
Monocytes Absolute: 0.4 10*3/uL (ref 0.1–1.0)
Monocytes Relative: 4 %
Neutro Abs: 8.8 10*3/uL — ABNORMAL HIGH (ref 1.7–7.7)
Neutrophils Relative %: 89 %
Platelets: 158 10*3/uL (ref 150–400)
RBC: 4.26 MIL/uL (ref 3.87–5.11)
RDW: 15 % (ref 11.5–15.5)
WBC: 9.8 10*3/uL (ref 4.0–10.5)
nRBC: 0 % (ref 0.0–0.2)

## 2023-05-01 LAB — BASIC METABOLIC PANEL WITH GFR
Anion gap: 6 (ref 5–15)
BUN: 16 mg/dL (ref 8–23)
CO2: 25 mmol/L (ref 22–32)
Calcium: 9.6 mg/dL (ref 8.9–10.3)
Chloride: 106 mmol/L (ref 98–111)
Creatinine, Ser: 1.46 mg/dL — ABNORMAL HIGH (ref 0.44–1.00)
GFR, Estimated: 35 mL/min — ABNORMAL LOW (ref >60)
Glucose, Bld: 118 mg/dL — ABNORMAL HIGH (ref 70–99)
Potassium: 3.3 mmol/L — ABNORMAL LOW (ref 3.5–5.1)
Sodium: 137 mmol/L (ref 135–145)

## 2023-05-01 LAB — SURGICAL PCR SCREEN
MRSA, PCR: NEGATIVE
Staphylococcus aureus: NEGATIVE

## 2023-05-01 LAB — TYPE AND SCREEN
ABO/RH(D): O POS
Antibody Screen: NEGATIVE

## 2023-05-01 LAB — APTT: aPTT: 31 s (ref 24–36)

## 2023-05-01 LAB — VITAMIN D 25 HYDROXY (VIT D DEFICIENCY, FRACTURES): Vit D, 25-Hydroxy: 58.9 ng/mL (ref 30–100)

## 2023-05-01 MED ORDER — FLEET ENEMA RE ENEM: 1.0000 | ENEMA | Freq: Once | RECTAL | Status: DC | PRN

## 2023-05-01 MED ORDER — ATORVASTATIN CALCIUM 80 MG PO TABS
80.0000 mg | ORAL_TABLET | Freq: Every day | ORAL | Status: DC
  Administered 2023-05-01: 80 mg via ORAL
  Filled 2023-05-01: qty 1

## 2023-05-01 MED ORDER — HYDROMORPHONE HCL 1 MG/ML IJ SOLN: 0.5000 mg | INTRAMUSCULAR | Status: DC | PRN

## 2023-05-01 MED ORDER — METOPROLOL TARTRATE 25 MG PO TABS
12.5000 mg | ORAL_TABLET | Freq: Two times a day (BID) | ORAL | Status: DC
  Administered 2023-05-01: 12.5 mg via ORAL
  Filled 2023-05-01 (×2): qty 1

## 2023-05-01 MED ORDER — ENOXAPARIN SODIUM 30 MG/0.3ML IJ SOSY
30.0000 mg | PREFILLED_SYRINGE | INTRAMUSCULAR | Status: DC
  Administered 2023-05-01: 30 mg via SUBCUTANEOUS
  Filled 2023-05-01: qty 0.3

## 2023-05-01 MED ORDER — BENAZEPRIL HCL 20 MG PO TABS
20.0000 mg | ORAL_TABLET | Freq: Every day | ORAL | Status: DC
  Administered 2023-05-02 – 2023-05-05 (×4): 20 mg via ORAL
  Filled 2023-05-01 (×4): qty 1

## 2023-05-01 MED ORDER — ENSURE ENLIVE PO LIQD
237.0000 mL | Freq: Two times a day (BID) | ORAL | Status: DC
  Administered 2023-05-02 – 2023-05-03 (×3): 237 mL via ORAL

## 2023-05-01 MED ORDER — CEFAZOLIN SODIUM-DEXTROSE 2-4 GM/100ML-% IV SOLN
2.0000 g | INTRAVENOUS | Status: AC
  Administered 2023-05-02: 2 g via INTRAVENOUS
  Filled 2023-05-01: qty 100

## 2023-05-01 MED ORDER — ADULT MULTIVITAMIN W/MINERALS CH
1.0000 | ORAL_TABLET | Freq: Every day | ORAL | Status: DC
  Administered 2023-05-01 – 2023-05-05 (×5): 1 via ORAL
  Filled 2023-05-01 (×5): qty 1

## 2023-05-01 MED ORDER — ACETAMINOPHEN 500 MG PO TABS
1000.0000 mg | ORAL_TABLET | Freq: Once | ORAL | Status: AC
  Administered 2023-05-01: 1000 mg via ORAL
  Filled 2023-05-01: qty 2

## 2023-05-01 MED ORDER — CEFAZOLIN SODIUM-DEXTROSE 2-4 GM/100ML-% IV SOLN
2.0000 g | Freq: Once | INTRAVENOUS | Status: DC
  Filled 2023-05-01: qty 100

## 2023-05-01 MED ORDER — AMLODIPINE BESYLATE 10 MG PO TABS
10.0000 mg | ORAL_TABLET | Freq: Every day | ORAL | Status: DC
  Administered 2023-05-01 – 2023-05-05 (×5): 10 mg via ORAL
  Filled 2023-05-01 (×5): qty 1

## 2023-05-01 MED ORDER — HYDROCODONE-ACETAMINOPHEN 5-325 MG PO TABS: 1.0000 | ORAL_TABLET | Freq: Four times a day (QID) | ORAL | Status: DC | PRN

## 2023-05-01 MED ORDER — LORATADINE 10 MG PO TABS: 10.0000 mg | ORAL_TABLET | Freq: Every day | ORAL | Status: DC | PRN

## 2023-05-01 MED ORDER — FAMOTIDINE 20 MG PO TABS
20.0000 mg | ORAL_TABLET | Freq: Every day | ORAL | Status: DC
  Administered 2023-05-01 – 2023-05-05 (×5): 20 mg via ORAL
  Filled 2023-05-01 (×5): qty 1

## 2023-05-01 MED ORDER — MUPIROCIN 2 % EX OINT
1.0000 | TOPICAL_OINTMENT | Freq: Two times a day (BID) | CUTANEOUS | Status: DC
  Filled 2023-05-01: qty 22

## 2023-05-01 MED ORDER — ENOXAPARIN SODIUM 40 MG/0.4ML IJ SOSY: 40.0000 mg | PREFILLED_SYRINGE | INTRAMUSCULAR | Status: DC

## 2023-05-01 MED ORDER — BISACODYL 5 MG PO TBEC: 5.0000 mg | DELAYED_RELEASE_TABLET | Freq: Every day | ORAL | Status: DC | PRN

## 2023-05-01 NOTE — H&P (Signed)
 History and Physical    Robin Roberts YNW:[id number] DOB: 01/02/1934 DOA: 05/01/2023  PCP: Lyle San, MD (Confirm with patient/family/NH records and if not entered, this has to be entered at [facility] point of entry) Patient coming from: Home  I have personally briefly reviewed patient's old medical records in [facility]  Chief Complaint: I fell and broken my hip  HPI: Robin Roberts is a 89 y.o. female with medical history significant of HTN, CKD stage IIIb,, breast cancer, presented with fall and hip fracture.  At baseline patient has a chronic ambulation impairment using rolling walker to ambulate.  Yesterday morning, patient woke up, when she tried to stand up and walk, she lost her balance and fell to the left side and hit her hip.  Yesterday, patient still able to ambulate with her walker with increasing left hip pain.  Denied any LOC, no head or neck injuries, no chest pain shortness of breath.  At baseline patient is not very active because of her ambulation status, but denies any shortness of breath or chest pain with ambulate, she lives by herself and able to complete all of her daily activities without difficulties. ED Course: Afebrile, blood and tachycardia blood pressure 160/90 O2 saturation 97% on room air.  CT hip showed mildly displaced left hip subcapital fracture.  Patient was given morphine and p.o. oxycodone for pain control.  Review of Systems: As per HPI otherwise 14 point review of systems negative.    Past Medical History:  Diagnosis Date   Actinic keratosis    Basal cell carcinoma    Breast cancer (HCC)    Cancer (HCC)    skin   Cancer (HCC) right   breast cancer 2012   Coronary artery disease    GERD (gastroesophageal reflux disease)    Hypertension    Personal history of radiation therapy 2012   Squamous cell carcinoma of skin 10/15/2017   Right plantar foot. WD SCC   Squamous cell carcinoma of skin 07/11/2022   left 5th DIP, mohs at [facility] 08/30/22     Past Surgical History:  Procedure Laterality Date   ABDOMINAL HYSTERECTOMY     BREAST BIOPSY Right 2016   FIBROCYSTIC CHANGE    BREAST LUMPECTOMY Right 2012   DCIS   COLONOSCOPY WITH PROPOFOL  N/A 10/11/2014   Procedure: COLONOSCOPY WITH PROPOFOL ;  Surgeon: Cassie Click, MD;  Location: [facility] ENDOSCOPY;  Service: Endoscopy;  Laterality: N/A;   post status radiation therapy Right    breast cancer     reports that she has never smoked. She has never used smokeless tobacco. She reports that she does not drink alcohol and does not use drugs.  Allergies  Allergen Reactions   Penicillins     Family History  Problem Relation Age of Onset   Breast cancer Daughter 87     Prior to Admission medications   Medication Sig Start Date End Date Taking? Authorizing Provider  amLODipine (NORVASC) 10 MG tablet Take 1 tablet by mouth daily. 02/01/20   [provider]  atorvastatin (LIPITOR) 80 MG tablet Take 80 mg by mouth daily. 02/01/20   [provider]  benazepril (LOTENSIN) 20 MG tablet Take 20 mg by mouth daily.    [provider]  famotidine (PEPCID) 20 MG tablet Take 20 mg by mouth daily. 02/01/20 01/31/21  [provider]  fluticasone (FLONASE) 50 MCG/ACT nasal spray Place 1 spray into both nostrils daily.    [provider]  hydrochlorothiazide (MICROZIDE)  12.5 MG capsule Take 12.5 mg by mouth daily.    [provider]  hydrocortisone  2.5 % cream Apply topically 2 (two) times daily as needed (Rash). For up to 2 weeks to face 05/09/21   Artemio Larry, MD  ketorolac  (ACULAR ) 0.5 % ophthalmic solution Place 1 drop into the right eye 4 (four) times daily. 02/03/17   Cuthriell, Jonathan D, PA-C  loratadine (CLARITIN) 10 MG tablet Take 10 mg by mouth daily as needed for allergies.    [provider]  mirtazapine (REMERON) 7.5 MG tablet Take 7.5 mg by mouth at bedtime. Patient not taking: Reported on 04/05/2020 02/01/20 01/31/21   [provider]  mupirocin  ointment (BACTROBAN ) 2 % Apply with each band-aid change. 07/11/22   Stewart, Tara, MD  Probiotic, Lactobacillus, CAPS Take by mouth.    [provider]  triamterene-hydrochlorothiazide (MAXZIDE-25) 37.5-25 MG tablet Take 1 tablet by mouth daily.    [provider]    Physical Exam: Vitals:   05/01/23 0925 05/01/23 1000  BP: (!) 163/91 (!) 158/88  Pulse: (!) 103 84  Resp: 18   Temp: 97.7 F (36.5 C)   TempSrc: Oral   SpO2: 97% 97%  Weight: 70 kg   Height: 5\' 7"  (1.702 m)     Constitutional: NAD, calm, comfortable Vitals:   05/01/23 0925 05/01/23 1000  BP: (!) 163/91 (!) 158/88  Pulse: (!) 103 84  Resp: 18   Temp: 97.7 F (36.5 C)   TempSrc: Oral   SpO2: 97% 97%  Weight: 70 kg   Height: 5\' 7"  (1.702 m)    Eyes: PERRL, lids and conjunctivae normal ENMT: Mucous membranes are moist. Posterior pharynx clear of any exudate or lesions.Normal dentition.  Neck: normal, supple, no masses, no thyromegaly Respiratory: clear to auscultation bilaterally, no wheezing, no crackles. Normal respiratory effort. No accessory muscle use.  Cardiovascular: Regular rate and rhythm, no murmurs / rubs / gallops. No extremity edema. 2+ pedal pulses. No carotid bruits.  Abdomen: no tenderness, no masses palpated. No hepatosplenomegaly. Bowel sounds positive.  Musculoskeletal: no clubbing / cyanosis. No joint deformity upper and lower extremities. Good ROM, no contractures. Normal muscle tone.  Skin: no rashes, lesions, ulcers. No induration Neurologic: CN 2-12 grossly intact. Sensation intact, DTR normal. Strength 5/5 in all 4.  Psychiatric: Normal judgment and insight. Alert and oriented x 3. Normal mood.     Labs on Admission: I have personally reviewed following labs and imaging studies  CBC: Recent Labs  Lab 05/01/23 1002  WBC 9.8  NEUTROABS 8.8*  HGB 12.5  HCT 37.8  MCV 88.7  PLT 158   Basic Metabolic Panel: Recent Labs  Lab  05/01/23 1002  NA 137  K 3.3*  CL 106  CO2 25  GLUCOSE 118*  BUN 16  CREATININE 1.46*  CALCIUM 9.6   GFR: Estimated Creatinine Clearance: 26.4 mL/min (A) (by C-G formula based on SCr of 1.46 mg/dL (H)). Liver Function Tests: No results for input(s): "AST", "ALT", "ALKPHOS", "BILITOT", "PROT", "ALBUMIN" in the last 168 hours. No results for input(s): "LIPASE", "AMYLASE" in the last 168 hours. No results for input(s): "AMMONIA" in the last 168 hours. Coagulation Profile: Recent Labs  Lab 05/01/23 1002  INR 1.1   Cardiac Enzymes: No results for input(s): "CKTOTAL", "CKMB", "CKMBINDEX", "TROPONINI" in the last 168 hours. BNP (last 3 results) No results for input(s): "PROBNP" in the last 8760 hours. HbA1C: No results for input(s): "HGBA1C" in the last 72 hours. CBG: No results for  input(s): "GLUCAP" in the last 168 hours. Lipid Profile: No results for input(s): "CHOL", "HDL", "LDLCALC", "TRIG", "CHOLHDL", "LDLDIRECT" in the last 72 hours. Thyroid Function Tests: No results for input(s): "TSH", "T4TOTAL", "FREET4", "T3FREE", "THYROIDAB" in the last 72 hours. Anemia Panel: No results for input(s): "VITAMINB12", "FOLATE", "FERRITIN", "TIBC", "IRON", "RETICCTPCT" in the last 72 hours. Urine analysis: No results found for: "COLORURINE", "APPEARANCEUR", "LABSPEC", "PHURINE", "GLUCOSEU", "HGBUR", "BILIRUBINUR", "KETONESUR", "PROTEINUR", "UROBILINOGEN", "NITRITE", "LEUKOCYTESUR"  Radiological Exams on Admission: CT Hip Left Wo Contrast Result Date: 05/01/2023 CLINICAL DATA:  Left hip fracture after fall EXAM: CT OF THE LEFT HIP WITHOUT CONTRAST TECHNIQUE: Multidetector CT imaging of the left hip was performed according to the standard protocol. Multiplanar CT image reconstructions were also generated. RADIATION DOSE REDUCTION: This exam was performed according to the departmental dose-optimization program which includes automated exposure control, adjustment of the mA and/or kV according  to patient size and/or use of iterative reconstruction technique. COMPARISON:  Left hip radiographs dated 05/01/2023 at 9:54 a.m. FINDINGS: Bones/Joint/Cartilage Acute mildly impacted subcapital fracture of the left proximal femur with approximately 10 mm of anterior displacement of the posterolateral femoral neck at the level of the posterior femoral head and neck junction. The left femoral head is seated within the acetabulum. Mild-to-moderate superior joint space narrowing of the left hip. Pubic symphysis is anatomically aligned. Ligaments Ligaments are suboptimally evaluated by CT. Muscles and Tendons Muscles are normal. No muscle atrophy. No intramuscular fluid collection or hematoma. Soft tissue No fluid collection or hematoma. Partially visualized sigmoid colonic diverticulosis. Atherosclerotic vascular calcification. IMPRESSION: Acute displaced and mildly impacted subcapital fracture of the left proximal femur. Electronically Signed   By: Mannie Seek M.D.   On: 05/01/2023 11:37   DG Pelvis 1-2 Views Result Date: 05/01/2023 CLINICAL DATA:  Left hip pain after fall yesterday. EXAM: PELVIS - 1-2 VIEW COMPARISON:  None Available. FINDINGS: Moderately displaced proximal left femoral neck fracture is noted. No other bony abnormality is noted. IMPRESSION: Moderately displaced proximal left femoral neck fracture. Electronically Signed   By: Rosalene Colon M.D.   On: 05/01/2023 10:05   DG Chest Port 1 View Result Date: 05/01/2023 CLINICAL DATA:  Fall. EXAM: PORTABLE CHEST 1 VIEW COMPARISON:  None Available. FINDINGS: The heart size and mediastinal contours are within normal limits. Both lungs are clear. The visualized skeletal structures are unremarkable. IMPRESSION: No active disease. Aortic Atherosclerosis (ICD10-I70.0). Electronically Signed   By: Rosalene Colon M.D.   On: 05/01/2023 10:04   DG FEMUR MIN 2 VIEWS LEFT Result Date: 05/01/2023 CLINICAL DATA:  Left hip pain after fall yesterday. EXAM:  LEFT FEMUR 2 VIEWS COMPARISON:  None Available. FINDINGS: Moderately displaced subcapital fracture of proximal left femur is noted. Remaining portion of femur is unremarkable. IMPRESSION: Moderately displaced subcapital proximal left femoral fracture. Electronically Signed   By: Rosalene Colon M.D.   On: 05/01/2023 10:03    EKG: Independently reviewed.  Sinus rhythm, no acute ST changes.  Assessment/Plan Principal Problem:   Hip fracture (HCC) Active Problems:   Femoral neck fracture (HCC)  (please populate well all problems here in Problem List. (For example, if patient is on BP meds at home and you resume or decide to hold them, it is a problem that needs to be her. Same for CAD, COPD, HLD and so on)  Left subcapital proximal femoral fracture - Secondary to mechanical fall - ORIF tomorrow - Calculated revised 30 days per reoperation cardiac risks 3.9%, medically cleared for tomorrow's ORIF  with generalized anesthesia with acceptable risk.  Will check echocardiogram for baseline referral. - Start beta-blocker - Chemical DVT prophylaxis - Check vitamin D level  HTN - Discontinue HCTZ due to her elevated kidney function-continue amlodipine - Start low-dose of beta-blocker  CKD stage IIIb - Euvolemic, creatinine level stable   DVT prophylaxis: Lovenox Code Status: Full code Family Communication: None at bedside Disposition Plan: Patient sick with hip fracture requiring ORIF, expect 2 midnight hospital stay Consults called: Ortho.  Surgery Admission status: Telemetry admission   Frank Island MD [facility] Pager [phone]  05/01/2023, 1:08 PM

## 2023-05-01 NOTE — ED Triage Notes (Signed)
 Pt reports she got out of bed yesterday and lost balance and fell. Pt denies syncope or LOC. Pt reports since then she has been having left hip pain. Denies head injury, no thinners.

## 2023-05-01 NOTE — ED Notes (Addendum)
 This tech called [employer] letting them know I am transporting pt to floor and the secretary stated that there is no bed in the room. Secretary will call when bed is in room, until then pt remains in ED42.

## 2023-05-01 NOTE — Consult Note (Signed)
 ORTHOPAEDIC CONSULTATION  REQUESTING PHYSICIAN: Frank [place], MD  Chief Complaint:   L hip pain  History of Present Illness: Robin Roberts is a 89 y.o. female w/PMHx significant for CKD, HTN, and HLD who had a fall yesterday while getting out of bed.  The patient noted immediate hip pain. She ambulates unassisted at baseline, and was actually able to ambulate with a walker, albeit with significant pain. Pain is worse with any sort of movement of the hip. She lives at home in a 1st floor condo by herself. She presents with her brother who lives nearby.  X-rays in the emergency department show a left valgus impacted femoral neck fracture.  Past Medical History:  Diagnosis Date   Actinic keratosis    Basal cell carcinoma    Breast cancer (HCC)    Cancer (HCC)    skin   Cancer (HCC) right   breast cancer 2012   Coronary artery disease    GERD (gastroesophageal reflux disease)    Hypertension    Personal history of radiation therapy 2012   Squamous cell carcinoma of skin 10/15/2017   Right plantar foot. WD SCC   Squamous cell carcinoma of skin 07/11/2022   left 5th DIP, mohs at [facility] 08/30/22   Past Surgical History:  Procedure Laterality Date   ABDOMINAL HYSTERECTOMY     BREAST BIOPSY Right 2016   FIBROCYSTIC CHANGE    BREAST LUMPECTOMY Right 2012   DCIS   COLONOSCOPY WITH PROPOFOL  N/A 10/11/2014   Procedure: COLONOSCOPY WITH PROPOFOL ;  Surgeon: Cassie Click, MD;  Location[facility] ENDOSCOPY;  Service: Endoscopy;  Laterality: N/A;   post status radiation therapy Right    breast cancer   Social History   Socioeconomic History   Marital status: Single    Spouse name: Not on file   Number of children: Not on file   Years of education: Not on file   Highest education level: Not on file  Occupational History   Not on file  Tobacco Use   Smoking status: Never   Smokeless tobacco: Never  Substance and Sexual  Activity   Alcohol use: No   Drug use: No   Sexual activity: Not on file  Other Topics Concern   Not on file  Social History Narrative   Not on file   Social Drivers of Health   Financial Resource Strain: Patient Declined (02/13/2023)   Received from [facility]   Overall Financial Resource Strain (CARDIA)    Difficulty of Paying Living Expenses: Patient declined  Food Insecurity: Patient Declined (02/13/2023)   Received from [facility]   Hunger Vital Sign    Worried About Running Out of Food in the Last Year: Patient declined    Ran Out of Food in the Last Year: Patient declined  Transportation Needs: Patient Declined (02/13/2023)   Received from [facility]   PRAPARE - Transportation    In the past 12 months, has lack of transportation kept you from medical appointments or from getting medications?: Patient declined    Lack of Transportation (Non-Medical): Patient declined  Physical Activity: Not on file  Stress: Not on file  Social Connections: Not on file   Family History  Problem Relation Age of Onset   Breast cancer Daughter 58   Allergies  Allergen Reactions   Penicillins    Prior to Admission medications   Medication Sig Start Date End Date Taking? Authorizing Provider  amLODipine (NORVASC) 10 MG  tablet Take 1 tablet by mouth daily. 02/01/20   [provider]  atorvastatin (LIPITOR) 80 MG tablet Take 80 mg by mouth daily. 02/01/20   [provider]  benazepril (LOTENSIN) 20 MG tablet Take 20 mg by mouth daily.    [provider]  famotidine (PEPCID) 20 MG tablet Take 20 mg by mouth daily. 02/01/20 01/31/21  [provider]  fluticasone (FLONASE) 50 MCG/ACT nasal spray Place 1 spray into both nostrils daily.    [provider]  hydrochlorothiazide (MICROZIDE) 12.5 MG capsule Take 12.5 mg by mouth daily.    [provider]  hydrocortisone  2.5 % cream Apply topically  2 (two) times daily as needed (Rash). For up to 2 weeks to face 05/09/21   Artemio Larry, MD  ketorolac  (ACULAR ) 0.5 % ophthalmic solution Place 1 drop into the right eye 4 (four) times daily. 02/03/17   Cuthriell, Jonathan D, PA-C  loratadine (CLARITIN) 10 MG tablet Take 10 mg by mouth daily as needed for allergies.    [provider]  mirtazapine (REMERON) 7.5 MG tablet Take 7.5 mg by mouth at bedtime. Patient not taking: Reported on 04/05/2020 02/01/20 01/31/21  [provider]  mupirocin  ointment (BACTROBAN ) 2 % Apply with each band-aid change. 07/11/22   Stewart, Tara, MD  Probiotic, Lactobacillus, CAPS Take by mouth.    [provider]  triamterene-hydrochlorothiazide (MAXZIDE-25) 37.5-25 MG tablet Take 1 tablet by mouth daily.    [provider]   Recent Labs    05/01/23 1002  WBC 9.8  HGB 12.5  HCT 37.8  PLT 158  K 3.3*  CL 106  CO2 25  BUN 16  CREATININE 1.46*  GLUCOSE 118*  CALCIUM 9.6  INR 1.1   CT Hip Left Wo Contrast Result Date: 05/01/2023 CLINICAL DATA:  Left hip fracture after fall EXAM: CT OF THE LEFT HIP WITHOUT CONTRAST TECHNIQUE: Multidetector CT imaging of the left hip was performed according to the standard protocol. Multiplanar CT image reconstructions were also generated. RADIATION DOSE REDUCTION: This exam was performed according to the departmental dose-optimization program which includes automated exposure control, adjustment of the mA and/or kV according to patient size and/or use of iterative reconstruction technique. COMPARISON:  Left hip radiographs dated 05/01/2023 at 9:54 a.m. FINDINGS: Bones/Joint/Cartilage Acute mildly impacted subcapital fracture of the left proximal femur with approximately 10 mm of anterior displacement of the posterolateral femoral neck at the level of the posterior femoral head and neck junction. The left femoral head is seated within the acetabulum. Mild-to-moderate superior joint space narrowing of the  left hip. Pubic symphysis is anatomically aligned. Ligaments Ligaments are suboptimally evaluated by CT. Muscles and Tendons Muscles are normal. No muscle atrophy. No intramuscular fluid collection or hematoma. Soft tissue No fluid collection or hematoma. Partially visualized sigmoid colonic diverticulosis. Atherosclerotic vascular calcification. IMPRESSION: Acute displaced and mildly impacted subcapital fracture of the left proximal femur. Electronically Signed   By: Mannie Seek M.D.   On: 05/01/2023 11:37   DG Pelvis 1-2 Views Result Date: 05/01/2023 CLINICAL DATA:  Left hip pain after fall yesterday. EXAM: PELVIS - 1-2 VIEW COMPARISON:  None Available. FINDINGS: Moderately displaced proximal left femoral neck fracture is noted. No other bony abnormality is noted. IMPRESSION: Moderately displaced proximal left femoral neck fracture. Electronically Signed   By: Rosalene Colon M.D.   On: 05/01/2023 10:05   DG Chest Port 1 View Result Date: 05/01/2023 CLINICAL DATA:  Fall. EXAM: PORTABLE CHEST 1 VIEW COMPARISON:  None Available. FINDINGS: The heart size and mediastinal contours are within normal limits. Both lungs are clear. The visualized skeletal structures are unremarkable. IMPRESSION: No active disease. Aortic Atherosclerosis (ICD10-I70.0). Electronically Signed   By: Rosalene Colon M.D.   On: 05/01/2023 10:04   DG FEMUR MIN 2 VIEWS LEFT Result Date: 05/01/2023 CLINICAL DATA:  Left hip pain after fall yesterday. EXAM: LEFT FEMUR 2 VIEWS COMPARISON:  None Available. FINDINGS: Moderately displaced subcapital fracture of proximal left femur is noted. Remaining portion of femur is unremarkable. IMPRESSION: Moderately displaced subcapital proximal left femoral fracture. Electronically Signed   By: Rosalene Colon M.D.   On: 05/01/2023 10:03     Positive ROS: All other systems have been reviewed and were otherwise negative with the exception of those mentioned in the HPI and as above.  Physical  Exam: BP (!) 158/88   Pulse 84   Temp 97.7 F (36.5 C) (Oral)   Resp 18   Ht 5\' 7"  (1.702 m)   Wt 70 kg   SpO2 97%   BMI 24.17 kg/m  General:  Alert, no acute distress Psychiatric:  Patient is competent for consent with normal mood and affect    Orthopedic Exam:  LLE: + DF/PF/EHL SILT grossly over foot Foot wwp +Log roll/axial load Leg shortened and externally rotated   Imaging:  As above: L valgus impacted femoral neck fracture  Assessment/Plan: TIERRE BELLON is a 89 y.o. female with a L valgus impacted femoral neck fracture  1. I discussed the various treatment options including both surgical and non-surgical management of the fracture with the patient. We discussed the high risk of perioperative complications due to patient's age and other co-morbidities. After discussion of risks, benefits, and alternatives to surgery, the family and/or patient were in agreement to proceed with surgery. The goals of surgery would be to provide adequate pain relief and allow for mobilization. Plan for surgery is L hip percutaneous pinning tomorrow 4/31/25. 2. NPO after midnight 3. Hold anticoagulation in advance of OR 4. Admit to hospitalist team.    Lorri Rota   05/01/2023 12:22 PM

## 2023-05-01 NOTE — Progress Notes (Signed)
 PHARMACIST - PHYSICIAN COMMUNICATION  CONCERNING:  Enoxaparin (Lovenox) for DVT Prophylaxis    RECOMMENDATION: Patient was prescribed enoxaprin 40mg  q24 hours for VTE prophylaxis.   Filed Weights   05/01/23 0925  Weight: 70 kg (154 lb 5.2 oz)    Body mass index is 24.17 kg/m.  Estimated Creatinine Clearance: 26.4 mL/min (A) (by C-G formula based on SCr of 1.46 mg/dL (H)).  Patient is candidate for enoxaparin 30mg  every 24 hours based on CrCl <36ml/min or Weight <45kg  DESCRIPTION: Pharmacy has adjusted enoxaparin dose per [facility] policy.  Patient is now receiving enoxaparin 30 mg every 24 hours    Alice Innocent, PharmD Clinical Pharmacist  05/01/2023 12:15 PM

## 2023-05-01 NOTE — Progress Notes (Signed)
 Initial Nutrition Assessment  DOCUMENTATION CODES:   Severe malnutrition in context of social or environmental circumstances  INTERVENTION:   -Ensure Enlive po BID, each supplement provides 350 kcal and 20 grams of protein.  -MVI with minerals daily -Liberalize diet to regular for widest variety of meal selections  NUTRITION DIAGNOSIS:   Severe Malnutrition related to social / environmental circumstances as evidenced by moderate fat depletion, severe fat depletion, moderate muscle depletion, severe muscle depletion.  GOAL:   Patient will meet greater than or equal to 90% of their needs  MONITOR:   PO intake, Supplement acceptance  REASON FOR ASSESSMENT:   Consult Assessment of nutrition requirement/status, Hip fracture protocol  ASSESSMENT:   Pt with medical history significant of HTN, CKD stage IIIb,, breast cancer, presented with fall and hip fracture.  Pt admitted with lt femur fracture.   Per orthopedics notes, plan for ORIF tomorrow. Pt will be NPO at midnight for procedure.   Spoke with pt at bedside, who was pleasant and in good spirits today. Pt just finished all of her lunch; she reports she was very hungry as she has not eaten since this morning. Pt shares that she is a "light" eater at baseline. She typically consumes 3 meals per day (Breakfast: pop tart and bacon; Lunch: 1/2 sub from local sandwich shop; Dinner: mea,t starch, and vegetable). Pt denies any difficulty chewing and swallowing. She is independent at baseline, however, has been using a wheelchair to get around the past few days PTA secondary to pain.   Noted no wt loss over the past 3 months. Wt from 02/13/23 is identifiable from admission wt; suspect that this may be a stated wt vs measured wt. RD will request another weight to better assess acute weight changes. Pt estimates she has had progressive weight loss over the past several months. She reports she is usually 125# and suspect she has lost 10-15#.    Discussed importance of good meal and supplement intake to promote healing. Pt amenable to supplements.   Medications reviewed and include lovenox.   Labs reviewed: K: 3.3.    NUTRITION - FOCUSED PHYSICAL EXAM:  Flowsheet Row Most Recent Value  Orbital Region Severe depletion  Upper Arm Region Moderate depletion  Thoracic and Lumbar Region Moderate depletion  Buccal Region Severe depletion  Temple Region Severe depletion  Clavicle Bone Region Severe depletion  Clavicle and Acromion Bone Region Severe depletion  Scapular Bone Region Severe depletion  Dorsal Hand Severe depletion  Patellar Region Severe depletion  Anterior Thigh Region Severe depletion  Posterior Calf Region Severe depletion  Edema (RD Assessment) None  Hair Reviewed  Eyes Reviewed  Mouth Reviewed  Skin Reviewed  Nails Reviewed       Diet Order:   Diet Order             Diet NPO time specified  Diet effective midnight           Diet regular Room service appropriate? Yes; Fluid consistency: Thin  Diet effective now                   EDUCATION NEEDS:   Education needs have been addressed  Skin:  Skin Assessment: Reviewed RN Assessment  Last BM:  Unknown  Height:   Ht Readings from Last 1 Encounters:  05/01/23 5\' 7"  (1.702 m)    Weight:   Wt Readings from Last 1 Encounters:  05/01/23 70 kg    Ideal Body Weight:  61.4 kg  BMI:  Body mass index is 24.17 kg/m.  Estimated Nutritional Needs:   Kcal:  1650-1850  Protein:  80-95 grams  Fluid:  1.6-1.8 L    Herschel Lords, RD, LDN, CDCES Registered Dietitian III Certified Diabetes Care and Education Specialist If unable to reach this RD, please use "RD Inpatient" group chat on secure chat between hours of 8am-4 pm daily

## 2023-05-01 NOTE — ED Notes (Addendum)
 Patient in X-ray. Husband at bedside.

## 2023-05-01 NOTE — ED Provider Notes (Signed)
 [facility] Provider Note    Event Date/Time   First MD Initiated Contact with Patient 05/01/23 [phone]     (approximate)   History   Hip Pain  Pt reports she got out of bed yesterday and lost balance and fell. Pt denies syncope or LOC. Pt reports since then she has been having left hip pain. Denies head injury, no thinners.   HPI Robin Roberts is a 89 y.o. female PMH hypertension, hyperlipidemia, CKD presents for evaluation of hip pain - Patient states she fell yesterday morning while getting out of bed and attempting to get to her walker.  Landed on her left side.  Denies any head strike, no LOC.  Got up, walked around the home with her walker yesterday but has been having increasing left hip pain since then so decided to come to emergency department today. -No p.o. intake today, not on blood thinners -No other complaints beyond left hip pain.  No headache or vomiting.     Physical Exam   Triage Vital Signs: ED Triage Vitals [05/01/23 0925]  Encounter Vitals Group     BP (!) 163/91     Systolic BP Percentile      Diastolic BP Percentile      Pulse Rate (!) 103     Resp 18     Temp 97.7 F (36.5 C)     Temp Source Oral     SpO2 97 %     Weight 154 lb 5.2 oz (70 kg)     Height 5\' 7"  (1.702 m)     Head Circumference      Peak Flow      Pain Score 8     Pain Loc      Pain Education      Exclude from Growth Chart     Most recent vital signs: Vitals:   05/01/23 0925 05/01/23 1000  BP: (!) 163/91 (!) 158/88  Pulse: (!) 103 84  Resp: 18   Temp: 97.7 F (36.5 C)   SpO2: 97% 97%     General: Awake, no distress.  HEENT: No external evidence of head trauma Neck:  Full rom, no ttp CV:  Good peripheral perfusion. RRR, RP 2+ Resp:  Normal effort. CTAB Abd:  No distention. Nontender to deep palpation throughout LLE:  +ttp L hip.  No significant malrotation.  Mildly shortened compared to right leg.  Wiggles toes, foot warm to touch, good pedal  pulse.   ED Results / Procedures / Treatments   Labs (all labs ordered are listed, but only abnormal results are displayed) Labs Reviewed  CBC WITH DIFFERENTIAL/PLATELET - Abnormal; Notable for the following components:      Result Value   Neutro Abs 8.8 (*)    Lymphs Abs 0.4 (*)    All other components within normal limits  BASIC METABOLIC PANEL WITH GFR - Abnormal; Notable for the following components:   Potassium 3.3 (*)    Glucose, Bld 118 (*)    Creatinine, Ser 1.46 (*)    GFR, Estimated 35 (*)    All other components within normal limits  PROTIME-INR  APTT  TYPE AND SCREEN     EKG  Ecg = nsr, rate 82, no ST elevation or depression, no significant repolarization abnormality.  Left anterior fascicular block.  No evidence of ischemia nor arrhythmia.   RADIOLOGY Radiology interpreted by myself and radiology reports reviewed.  Notable for femoral neck fracture.  PROCEDURES:  Critical Care  performed: No  Procedures   MEDICATIONS ORDERED IN ED: Medications - No data to display   IMPRESSION / MDM / ASSESSMENT AND PLAN / ED COURSE  I reviewed the triage vital signs and the nursing notes.                              DDX/MDM/AP: Differential diagnosis includes, but is not limited to, hip fracture.  Appears to be from mechanical fall, given age will get screening labs to ensure no other contributing etiology.  Do not clinically suspect intracranial hemorrhage, skull fracture, C-spine injury at this time given patient is 1 day out with no associated symptoms and no external evidence of trauma --will defer head/neck imaging at this time.  Plan: - X-ray left hip, femur, screening chest x-ray - Basic labs - EKG - N.p.o. - Pain control, patient declines pain medication currently  Patient's presentation is most consistent with acute presentation with potential threat to life or bodily function.  The patient is on the cardiac monitor to evaluate for evidence of  arrhythmia and/or significant heart rate changes.  ED course below.  Admitted to hospitalist service for surgical repair tomorrow, orthopedics aware, requested CT left hip for surgical planning.  Clinical Course as of 05/01/23 1111  Wed May 01, 2023  1010 Cbc wnl [MM]  1011 X-rays reviewed, notable for left femoral neck fracture [MM]  1027 D/w Dr. Lydia Sams of ortho - CT left hip for surgical planning -Admit to hospitalist -N.p.o. at midnight, OR tomorrow  Hospitalist consult order placed [MM]    Clinical Course User Index [MM] Collis Deaner, MD     FINAL CLINICAL IMPRESSION(S) / ED DIAGNOSES   Final diagnoses:  Closed displaced fracture of left femoral neck (HCC)  Fall, initial encounter     Rx / DC Orders   ED Discharge Orders     None        Note:  This document was prepared using Dragon voice recognition software and may include unintentional dictation errors.   Collis Deaner, MD 05/01/23 [phone]

## 2023-05-02 ENCOUNTER — Other Ambulatory Visit: Payer: Self-pay

## 2023-05-02 ENCOUNTER — Inpatient Hospital Stay: Admitting: Anesthesiology

## 2023-05-02 ENCOUNTER — Inpatient Hospital Stay

## 2023-05-02 ENCOUNTER — Encounter
Admission: EM | Disposition: A | Discharge status: Home Health | Payer: Self-pay | Source: Home / Self Care | Attending: Student

## 2023-05-02 ENCOUNTER — Encounter: Payer: Self-pay | Admitting: Internal Medicine

## 2023-05-02 DIAGNOSIS — S72002A Fracture of unspecified part of neck of left femur, initial encounter for closed fracture: Secondary | ICD-10-CM | POA: Diagnosis not present

## 2023-05-02 DIAGNOSIS — I10 Essential (primary) hypertension: Secondary | ICD-10-CM | POA: Diagnosis present

## 2023-05-02 DIAGNOSIS — N1832 Chronic kidney disease, stage 3b: Secondary | ICD-10-CM | POA: Insufficient documentation

## 2023-05-02 DIAGNOSIS — E43 Unspecified severe protein-calorie malnutrition: Secondary | ICD-10-CM | POA: Insufficient documentation

## 2023-05-02 HISTORY — PX: HIP PINNING,CANNULATED: SHX1758

## 2023-05-02 LAB — CBC
HCT: 40.2 % (ref 36.0–46.0)
Hemoglobin: 13.4 g/dL (ref 12.0–15.0)
MCH: 29.3 pg (ref 26.0–34.0)
MCHC: 33.3 g/dL (ref 30.0–36.0)
MCV: 88 fL (ref 80.0–100.0)
Platelets: 169 10*3/uL (ref 150–400)
RBC: 4.57 MIL/uL (ref 3.87–5.11)
RDW: 15 % (ref 11.5–15.5)
WBC: 9.4 10*3/uL (ref 4.0–10.5)
nRBC: 0 % (ref 0.0–0.2)

## 2023-05-02 LAB — URINALYSIS, ROUTINE W REFLEX MICROSCOPIC
Bilirubin Urine: NEGATIVE
Glucose, UA: 50 mg/dL — AB
Ketones, ur: 5 mg/dL — AB
Nitrite: POSITIVE — AB
Protein, ur: NEGATIVE mg/dL
RBC / HPF: >50 RBC/hpf (ref 0–5)
Specific Gravity, Urine: 1.017 (ref 1.005–1.030)
pH: 5 (ref 5.0–8.0)

## 2023-05-02 LAB — ECHOCARDIOGRAM COMPLETE
Area-P 1/2: 4.02 cm2
Height: 67 in
S' Lateral: 2.2 cm
Weight: 2469.15 [oz_av]

## 2023-05-02 LAB — BASIC METABOLIC PANEL WITH GFR
Anion gap: 7 (ref 5–15)
BUN: 17 mg/dL (ref 8–23)
CO2: 24 mmol/L (ref 22–32)
Calcium: 9.8 mg/dL (ref 8.9–10.3)
Chloride: 106 mmol/L (ref 98–111)
Creatinine, Ser: 1.22 mg/dL — ABNORMAL HIGH (ref 0.44–1.00)
GFR, Estimated: 43 mL/min — ABNORMAL LOW (ref >60)
Glucose, Bld: 117 mg/dL — ABNORMAL HIGH (ref 70–99)
Potassium: 3.3 mmol/L — ABNORMAL LOW (ref 3.5–5.1)
Sodium: 137 mmol/L (ref 135–145)

## 2023-05-02 SURGERY — FIXATION, FEMUR, NECK, PERCUTANEOUS, USING SCREW
Anesthesia: General | Site: Hip | Laterality: Left

## 2023-05-02 MED ORDER — BUPIVACAINE LIPOSOME 1.3 % IJ SUSP
INTRAMUSCULAR | Status: DC | PRN
  Administered 2023-05-02: 18 mL

## 2023-05-02 MED ORDER — SENNOSIDES-DOCUSATE SODIUM 8.6-50 MG PO TABS
1.0000 | ORAL_TABLET | Freq: Every evening | ORAL | Status: DC | PRN
  Filled 2023-05-02: qty 1

## 2023-05-02 MED ORDER — BISACODYL 10 MG RE SUPP: 10.0000 mg | Freq: Every day | RECTAL | Status: DC | PRN

## 2023-05-02 MED ORDER — POLYETHYLENE GLYCOL 3350 17 G PO PACK: 34.0000 g | PACK | Freq: Every day | ORAL | Status: DC

## 2023-05-02 MED ORDER — FENTANYL CITRATE (PF) 100 MCG/2ML IJ SOLN: 25.0000 ug | INTRAMUSCULAR | Status: DC | PRN

## 2023-05-02 MED ORDER — ATORVASTATIN CALCIUM 20 MG PO TABS
40.0000 mg | ORAL_TABLET | Freq: Every day | ORAL | Status: DC
  Administered 2023-05-02 – 2023-05-05 (×4): 40 mg via ORAL
  Filled 2023-05-02 (×4): qty 2

## 2023-05-02 MED ORDER — 0.9 % SODIUM CHLORIDE (POUR BTL) OPTIME
TOPICAL | Status: DC | PRN
  Administered 2023-05-02: 500 mL

## 2023-05-02 MED ORDER — EPHEDRINE 5 MG/ML INJ
INTRAVENOUS | Status: AC
  Filled 2023-05-02: qty 5

## 2023-05-02 MED ORDER — OXYCODONE HCL 5 MG PO TABS: 5.0000 mg | ORAL_TABLET | ORAL | Status: DC | PRN

## 2023-05-02 MED ORDER — PROPOFOL 10 MG/ML IV BOLUS
INTRAVENOUS | Status: AC
  Filled 2023-05-02: qty 20

## 2023-05-02 MED ORDER — BUPIVACAINE LIPOSOME 1.3 % IJ SUSP: INTRAMUSCULAR | Status: DC | PRN

## 2023-05-02 MED ORDER — OXYCODONE HCL 5 MG/5ML PO SOLN: 5.0000 mg | Freq: Once | ORAL | Status: DC | PRN

## 2023-05-02 MED ORDER — DOCUSATE SODIUM 100 MG PO CAPS
100.0000 mg | ORAL_CAPSULE | Freq: Two times a day (BID) | ORAL | Status: DC
  Administered 2023-05-02 – 2023-05-05 (×6): 100 mg via ORAL
  Filled 2023-05-02 (×6): qty 1

## 2023-05-02 MED ORDER — OXYCODONE HCL 5 MG PO TABS
2.5000 mg | ORAL_TABLET | ORAL | Status: DC | PRN
  Administered 2023-05-02: 2.5 mg via ORAL
  Filled 2023-05-02: qty 1

## 2023-05-02 MED ORDER — ONDANSETRON HCL 4 MG PO TABS
4.0000 mg | ORAL_TABLET | Freq: Four times a day (QID) | ORAL | Status: DC | PRN
Start: 2023-05-02 — End: 2023-05-05

## 2023-05-02 MED ORDER — METOCLOPRAMIDE HCL 5 MG PO TABS: 5.0000 mg | ORAL_TABLET | Freq: Three times a day (TID) | ORAL | Status: DC | PRN

## 2023-05-02 MED ORDER — FENTANYL CITRATE (PF) 100 MCG/2ML IJ SOLN
INTRAMUSCULAR | Status: AC
  Filled 2023-05-02: qty 2

## 2023-05-02 MED ORDER — CEFAZOLIN SODIUM-DEXTROSE 2-4 GM/100ML-% IV SOLN
2.0000 g | Freq: Four times a day (QID) | INTRAVENOUS | Status: AC
  Administered 2023-05-02 – 2023-05-03 (×3): 2 g via INTRAVENOUS
  Filled 2023-05-02 (×3): qty 100

## 2023-05-02 MED ORDER — ACETAMINOPHEN 10 MG/ML IV SOLN
1000.0000 mg | Freq: Once | INTRAVENOUS | Status: DC | PRN
  Administered 2023-05-02: 1000 mg via INTRAVENOUS

## 2023-05-02 MED ORDER — LIDOCAINE HCL (CARDIAC) PF 100 MG/5ML IV SOSY
PREFILLED_SYRINGE | INTRAVENOUS | Status: DC | PRN
  Administered 2023-05-02: 60 mg via INTRAVENOUS

## 2023-05-02 MED ORDER — ONDANSETRON HCL 4 MG/2ML IJ SOLN: 4.0000 mg | Freq: Once | INTRAMUSCULAR | Status: DC | PRN

## 2023-05-02 MED ORDER — PROPOFOL 10 MG/ML IV BOLUS
INTRAVENOUS | Status: DC | PRN
  Administered 2023-05-02: 100 mg via INTRAVENOUS

## 2023-05-02 MED ORDER — ACETAMINOPHEN 10 MG/ML IV SOLN
INTRAVENOUS | Status: AC
  Filled 2023-05-02: qty 100

## 2023-05-02 MED ORDER — ENOXAPARIN SODIUM 40 MG/0.4ML IJ SOSY
40.0000 mg | PREFILLED_SYRINGE | INTRAMUSCULAR | Status: DC
  Administered 2023-05-03 – 2023-05-05 (×3): 40 mg via SUBCUTANEOUS
  Filled 2023-05-02 (×3): qty 0.4

## 2023-05-02 MED ORDER — FENTANYL CITRATE (PF) 100 MCG/2ML IJ SOLN
INTRAMUSCULAR | Status: DC | PRN
  Administered 2023-05-02: 50 ug via INTRAVENOUS
  Administered 2023-05-02 (×2): 25 ug via INTRAVENOUS

## 2023-05-02 MED ORDER — EPHEDRINE SULFATE-NACL 50-0.9 MG/10ML-% IV SOSY
PREFILLED_SYRINGE | INTRAVENOUS | Status: DC | PRN
  Administered 2023-05-02: 10 mg via INTRAVENOUS

## 2023-05-02 MED ORDER — FLEET ENEMA RE ENEM: 1.0000 | ENEMA | Freq: Once | RECTAL | Status: DC | PRN

## 2023-05-02 MED ORDER — BUPIVACAINE HCL (PF) 0.5 % IJ SOLN: INTRAMUSCULAR | Status: DC | PRN

## 2023-05-02 MED ORDER — METHOCARBAMOL 1000 MG/10ML IJ SOLN: 500.0000 mg | Freq: Four times a day (QID) | INTRAMUSCULAR | Status: DC | PRN

## 2023-05-02 MED ORDER — OXYCODONE HCL 5 MG PO TABS: 5.0000 mg | ORAL_TABLET | Freq: Once | ORAL | Status: DC | PRN

## 2023-05-02 MED ORDER — DEXAMETHASONE SODIUM PHOSPHATE 10 MG/ML IJ SOLN
INTRAMUSCULAR | Status: DC | PRN
  Administered 2023-05-02: 5 mg via INTRAVENOUS

## 2023-05-02 MED ORDER — ONDANSETRON HCL 4 MG/2ML IJ SOLN
INTRAMUSCULAR | Status: DC | PRN
  Administered 2023-05-02: 4 mg via INTRAVENOUS

## 2023-05-02 MED ORDER — LACTATED RINGERS IV SOLN: INTRAVENOUS | Status: DC | PRN

## 2023-05-02 MED ORDER — METHOCARBAMOL 500 MG PO TABS
500.0000 mg | ORAL_TABLET | Freq: Four times a day (QID) | ORAL | Status: DC | PRN
  Administered 2023-05-05: 500 mg via ORAL
  Filled 2023-05-02: qty 1

## 2023-05-02 MED ORDER — METOCLOPRAMIDE HCL 5 MG/ML IJ SOLN: 5.0000 mg | Freq: Three times a day (TID) | INTRAMUSCULAR | Status: DC | PRN

## 2023-05-02 MED ORDER — ONDANSETRON HCL 4 MG/2ML IJ SOLN: 4.0000 mg | Freq: Four times a day (QID) | INTRAMUSCULAR | Status: DC | PRN

## 2023-05-02 MED ORDER — SENNA 8.6 MG PO TABS: 1.0000 | ORAL_TABLET | Freq: Every day | ORAL | Status: DC

## 2023-05-02 MED ORDER — HYDROMORPHONE HCL 1 MG/ML IJ SOLN: 0.2000 mg | INTRAMUSCULAR | Status: DC | PRN

## 2023-05-02 MED ORDER — ACETAMINOPHEN 500 MG PO TABS
1000.0000 mg | ORAL_TABLET | Freq: Three times a day (TID) | ORAL | Status: DC
  Administered 2023-05-02 – 2023-05-05 (×8): 1000 mg via ORAL
  Filled 2023-05-02 (×8): qty 2

## 2023-05-02 SURGICAL SUPPLY — 36 items
BIT DRILL CANN QC 4.9 LRG (BIT) IMPLANT
BIT DRILL CANNULATED 4.9 (BIT) ×1 IMPLANT
BLADE SURG 15 STRL LF DISP TIS (BLADE) ×1 IMPLANT
CHLORAPREP W/TINT 26 (MISCELLANEOUS) ×1 IMPLANT
DRAPE SHEET LG 3/4 BI-LAMINATE (DRAPES) ×1 IMPLANT
DRAPE SURG 17X11 SM STRL (DRAPES) ×2 IMPLANT
DRAPE U-SHAPE 47X51 STRL (DRAPES) ×2 IMPLANT
DRSG OPSITE POSTOP 3X4 (GAUZE/BANDAGES/DRESSINGS) ×1 IMPLANT
ELECTRODE REM PT RTRN 9FT ADLT (ELECTROSURGICAL) ×1 IMPLANT
GAUZE XEROFORM 1X8 LF (GAUZE/BANDAGES/DRESSINGS) ×1 IMPLANT
GLOVE BIOGEL PI IND STRL 8 (GLOVE) ×1 IMPLANT
GLOVE SURG SYN 7.5 E (GLOVE) ×2 IMPLANT
GLOVE SURG SYN 7.5 PF PI (GLOVE) ×2 IMPLANT
GOWN STRL REUS W/ TWL LRG LVL3 (GOWN DISPOSABLE) ×1 IMPLANT
GOWN STRL REUS W/ TWL XL LVL3 (GOWN DISPOSABLE) ×1 IMPLANT
GUIDEWIRE ASNIS 3.2 NONCAL (WIRE) IMPLANT
KIT TURNOVER CYSTO (KITS) ×1 IMPLANT
MANIFOLD NEPTUNE II (INSTRUMENTS) ×1 IMPLANT
MAT ABSORB FLUID 56X50 GRAY (MISCELLANEOUS) ×2 IMPLANT
NDL FILTER BLUNT 18X1 1/2 (NEEDLE) ×1 IMPLANT
NDL HYPO 22X1.5 SAFETY MO (MISCELLANEOUS) ×1 IMPLANT
NEEDLE FILTER BLUNT 18X1 1/2 (NEEDLE) IMPLANT
NEEDLE HYPO 22X1.5 SAFETY MO (MISCELLANEOUS) ×1 IMPLANT
NS IRRIG 500ML POUR BTL (IV SOLUTION) ×1 IMPLANT
PACK HIP COMPR (MISCELLANEOUS) ×1 IMPLANT
SCREW ASNIS 85MM (Screw) IMPLANT
SCREW CANN 6.5X80 STRL (Screw) IMPLANT
STAPLER SKIN PROX 35W (STAPLE) ×1 IMPLANT
SUT VIC AB 0 CT1 36 (SUTURE) ×1 IMPLANT
SUT VIC AB 2-0 CT1 TAPERPNT 27 (SUTURE) ×1 IMPLANT
SYR 30ML LL (SYRINGE) ×1 IMPLANT
SYR 5ML LL (SYRINGE) ×1 IMPLANT
TAPE CLOTH 3X10 WHT NS LF (GAUZE/BANDAGES/DRESSINGS) ×1 IMPLANT
TRAP FLUID SMOKE EVACUATOR (MISCELLANEOUS) ×1 IMPLANT
WASHER SCREW MATTA SS 13.0X1.5 (Washer) IMPLANT
WATER STERILE IRR 500ML POUR (IV SOLUTION) ×1 IMPLANT

## 2023-05-02 NOTE — Anesthesia Preprocedure Evaluation (Signed)
 Anesthesia Evaluation  Patient identified by MRN, date of birth, ID band Patient awake    Reviewed: Allergy & Precautions, NPO status , Patient's Chart, lab work & pertinent test results  History of Anesthesia Complications Negative for: history of anesthetic complications  Airway Mallampati: II  TM Distance: >3 FB Neck ROM: Full    Dental no notable dental hx. (+) Teeth Intact   Pulmonary neg pulmonary ROS, neg sleep apnea, neg COPD, Patient abstained from smoking.Not current smoker   Pulmonary exam normal breath sounds clear to auscultation       Cardiovascular Exercise Tolerance: Good METShypertension, Pt. on medications + CAD  (-) Past MI (-) dysrhythmias  Rhythm:Regular Rate:Normal - Systolic murmurs    Neuro/Psych negative neurological ROS  negative psych ROS   GI/Hepatic ,GERD  Controlled,,(+)     (-) substance abuse    Endo/Other  neg diabetes    Renal/GU CRFRenal disease     Musculoskeletal   Abdominal   Peds  Hematology Received 30mg  subcutaneous enoxaparin  at 8pm last night (16hrs ago).   Anesthesia Other Findings Past Medical History: No date: Actinic keratosis No date: Basal cell carcinoma No date: Breast cancer (HCC) No date: Cancer [facility])     Comment:  skin right: Cancer (HCC)     Comment:  breast cancer 2012 No date: Coronary artery disease No date: GERD (gastroesophageal reflux disease) No date: Hypertension 2012: Personal history of radiation therapy 10/15/2017: Squamous cell carcinoma of skin     Comment:  Right plantar foot. [facility] 07/11/2022: Squamous cell carcinoma of skin     Comment:  left 5th DIP, m[facility]s at [facility] 08/30/22  Reproductive/Obstetrics                             Anesthesia Physical Anesthesia Plan  ASA: 2  Anesthesia Plan: General   Post-op Pain Management: Ofirmev  IV (intra-op)*   Induction: Intravenous  PONV Risk Score and Plan: 3 and  Ondansetron , Dexamethasone  and Treatment may vary due to age or medical condition  Airway Management Planned: LMA  Additional Equipment: None  Intra-op Plan:   Post-operative Plan: Extubation in OR  Informed Consent: I have reviewed the patients History and Physical, chart, labs and discussed the procedure including the risks, benefits and alternatives for the proposed anesthesia with the patient or authorized representative who has indicated his/her understanding and acceptance.     Dental advisory given  Plan Discussed with: CRNA and Surgeon  Anesthesia Plan Comments: (Discussed risks of anesthesia with patient, including PONV, sore throat, lip/dental/eye damage. Rare risks discussed as well, such as cardiorespiratory and neurological sequelae, and allergic reactions. Discussed the role of CRNA in patient's perioperative care. Patient understands. Patient has listed allergy to PCN - "throat swelling" Severe blistering skin reaction (SJS/TEN)? no Liver or kidney injury caused by PCN? no Hemolytic anemia from PCN? no Drug fever? no Painful swollen joints? no Severe reaction involving inside of mouth, eye, or genital ulcers? no Based on current evidence Remi Carina et al, J Allergy Clin Immunol Pract, 2019), will proceed with cefazolin  use: Yes  )       Anesthesia Quick Evaluation

## 2023-05-02 NOTE — Transfer of Care (Signed)
 Immediate Anesthesia Transfer of Care Note  Patient: Robin Roberts  Procedure(s) Performed: FIXATION, FEMUR, NECK, PERCUTANEOUS, USING SCREW (Left: Hip)  Patient Location: PACU  Anesthesia Type:General  Level of Consciousness: drowsy  Airway & Oxygen Therapy: Patient Spontanous Breathing and Patient connected to face mask oxygen  Post-op Assessment: Report given to RN and Post -op Vital signs reviewed and stable  Post vital signs: Reviewed and stable  Last Vitals:  Vitals Value Taken Time  BP 126/70 05/02/23 1400  Temp    Pulse 63 05/02/23 1400  Resp 12 05/02/23 1400  SpO2 97 % 05/02/23 1400  Vitals shown include unfiled device data.  Last Pain:  Vitals:   05/02/23 1226  TempSrc: Temporal  PainSc: 0-No pain      Patients Stated Pain Goal: 2 (05/01/23 1340)  Complications: No notable events documented.

## 2023-05-02 NOTE — H&P (Signed)
 H&P reviewed. No significant changes noted.

## 2023-05-02 NOTE — Progress Notes (Signed)
 Dr. Girard Lam : anesthesia called regarding Robin Roberts answering simple questions: knows her name, birthday but unable to answer where she is, what procedure we did today.  Anesthesia believes r/t anesthesia/procedure. Vitals stable, afebrile. OK to return to room 139.

## 2023-05-02 NOTE — Anesthesia Procedure Notes (Signed)
 Procedure Name: LMA Insertion Date/Time: 05/02/2023 1:02 PM  Performed by: Bill Budd, CRNAPre-anesthesia Checklist: Patient identified, Patient being monitored, Timeout performed, Emergency Drugs available and Suction available Patient Re-evaluated:Patient Re-evaluated prior to induction Oxygen Delivery Method: Circle system utilized Preoxygenation: Pre-oxygenation with 100% oxygen Induction Type: IV induction Ventilation: Mask ventilation without difficulty LMA: LMA inserted Tube type: Oral Tube size: 4.0 mm Number of attempts: 1 Placement Confirmation: positive ETCO2 and breath sounds checked- equal and bilateral Tube secured with: Tape Dental Injury: Teeth and Oropharynx as per pre-operative assessment

## 2023-05-02 NOTE — Plan of Care (Signed)

## 2023-05-02 NOTE — Op Note (Signed)
 DATE OF SURGERY: 05/02/2023  PREOPERATIVE DIAGNOSIS: Left valgus impacted femoral neck fracture  POSTOPERATIVE DIAGNOSIS: Left valgus impacted femoral neck fracture  PROCEDURE: Percutaneous pinning of Left femoral neck fracture  SURGEON: Cleotilde Dago, MD  ASSISTANTS: none  EBL: 20 cc  COMPONENTS:  Stryker 6.41mm cannulated screws x 3 with washers (80mm, 80mm, 85mm)   INDICATIONS: Robin Roberts is a 89 y.o. female who sustained a valgus impacted femoral neck fracture after a fall. Risks and benefits of percutaneous pinning were explained to the patient and family. Risks include but are not limited to bleeding, infection, injury to tissues, nerves, vessels, DVT/PE, malunion/nonunion, hardware failure, and risks of anesthesia. The patient and family understand these risks, have completed an informed consent, and wish to proceed.   PROCEDURE:  The patient was brought into the operating room. After administering spinal anesthesia, the patient was placed in the supine position on the Hana table. The uninjured leg was extended while the injured lower extremity was placed in a neutral position with care taken to not displace the fracture during positioning. The lateral aspects of the operative hip and thigh were prepped with ChloraPrep solution before being draped sterilely. IV antibiotics were administered. A timeout was performed to verify the appropriate surgical site, patient, and procedure.   The greater trochanter was identified and an approximately 5 cm incision was made over the lateral aspect of the proximal femur. The incision was carried down through the subcutaneous tissues to expose the IT band. This was split at the proximal portion of the incision and the vastus lateralis was split in line with its fibers. The lateral aspect of the femur was cleared of soft tissue. Under fluoroscopic guidance, a guidewire was placed along the inferior aspect of the femoral neck into the head while  ensuring the start point on the lateral cortex was not below the level of the lesser trochanter. A parallel guide was used to place two additional guidepins (superior posterior and superior anterior). Position of all pins was verified fluoroscopically in both the AP and lateral views. A measuring device was used the measure appropriate screw length. The guidepins were overdrilled. Appropriately sized screws were advanced starting with the inferior screw, then superior posterior, then superior anterior. Screws were sequentially tightened. Hardware position and bony alignment was confirmed fluoroscopically with AP and lateral views.   The wounds were irrigated thoroughly with sterile saline solution. Local anesthetic was injected. The IT band was closed with 0-Vicryl.  The subcutaneous tissues were closed using 2-0 Vicryl interrupted sutures. The skin was closed using staples. Sterile occlusive dressing was applied. The patient was then transferred to the recovery room in satisfactory condition after tolerating the procedure well.  POSTOPERATIVE PLAN: The patient will be WBAT on the operative extremity. Lovenox  40mg /day x 4 weeks to start on POD#1. Ancef  x 24 hours. PT/OT on POD#1.

## 2023-05-02 NOTE — Progress Notes (Addendum)
 PROGRESS NOTE    Robin Roberts  ZOX:[id number] DOB: 1933-07-17 DOA: 05/01/2023 PCP: Lyle San, MD     Brief Narrative:   Robin Roberts is a 89 y.o. female with medical history significant of HTN, CKD stage IIIb,, breast cancer, presented with fall and hip fracture.   At baseline patient has a chronic ambulation impairment using rolling walker to ambulate.  Yesterday morning, patient woke up, when she tried to stand up and walk, she lost her balance and fell to the left side and hit her hip.  Yesterday, patient still able to ambulate with her walker with increasing left hip pain.  Denied any LOC, no head or neck injuries, no chest pain shortness of breath.  At baseline patient is not very active because of her ambulation status, but denies any shortness of breath or chest pain with ambulate, she lives by herself and able to complete all of her daily activities without difficulties.   Assessment & Plan:   Principal Problem:   Hip fracture (HCC) Active Problems:   Femoral neck fracture (HCC)   Protein-calorie malnutrition, severe   Hypertension   CKD stage 3b, GFR 30-44 ml/min (HCC)  # Left subcapital femoral neck fracture # Mechanical fall Neurovascularly intact. Vit d wnl. No anemia thus far. - operative repair later today - bowel regimen, pain control - PT/OT consults tomorrow - outpt osteoporosis PCP f/u  # Urinary frequency - f/u urinalysis  # HTN  Bp moderately elevated - continue home amlodipine , benazapril, statin  # CKD 3b Kidney function is at baseline - monitor   DVT prophylaxis: lovenox  Code Status: DNR/DNI, confirmed with patient at bedside 5/1 (10 min ACP discussion) Family Communication:   Level of care: Telemetry Medical Status is: Inpatient Remains inpatient appropriate because: need for inpatient procedure    Consultants:  orthopedics  Procedures: pending  Antimicrobials:  Surgical ppx    Subjective: Mild left hip  pain  Objective: Vitals:   05/01/23 2117 05/01/23 2355 05/02/23 0358 05/02/23 0809  BP: 128/72 134/81 (!) 143/80 (!) 150/91  Pulse: 72 77 80 83  Resp: 16 16 14 17   Temp: 97.9 F (36.6 C) 98 F (36.7 C) 98.1 F (36.7 C) 97.9 F (36.6 C)  TempSrc: Oral Oral Oral Oral  SpO2: 94% 96% 93% 94%  Weight:      Height:        Intake/Output Summary (Last 24 hours) at 05/02/2023 0937 Last data filed at 05/01/2023 2300 Gross per 24 hour  Intake 240 ml  Output 300 ml  Net -60 ml   Filed Weights   05/01/23 0925  Weight: 70 kg    Examination:  General exam: Appears calm and comfortable  Respiratory system: Clear to auscultation. Respiratory effort normal. Cardiovascular system: S1 & S2 heard, RRR. Gastrointestinal system: Abdomen is nondistended, soft and nontender. No organomegaly or masses felt. Normal bowel sounds heard. Central nervous system: Alert and oriented. No focal neurological deficits. Extremities: external rotation left hip. warm Skin: No rashes, lesions or ulcers Psychiatry: Judgement and insight appear normal. Mood & affect appropriate.     Data Reviewed: I have personally reviewed following labs and imaging studies  CBC: Recent Labs  Lab 05/01/23 1002  WBC 9.8  NEUTROABS 8.8*  HGB 12.5  HCT 37.8  MCV 88.7  PLT 158   Basic Metabolic Panel: Recent Labs  Lab 05/01/23 1002  NA 137  K 3.3*  CL 106  CO2 25  GLUCOSE 118*  BUN 16  CREATININE  1.46*  CALCIUM  9.6   GFR: Estimated Creatinine Clearance: 26.4 mL/min (A) (by C-G formula based on SCr of 1.46 mg/dL (H)). Liver Function Tests: No results for input(s): "AST", "ALT", "ALKPHOS", "BILITOT", "PROT", "ALBUMIN" in the last 168 hours. No results for input(s): "LIPASE", "AMYLASE" in the last 168 hours. No results for input(s): "AMMONIA" in the last 168 hours. Coagulation Profile: Recent Labs  Lab 05/01/23 1002  INR 1.1   Cardiac Enzymes: No results for input(s): "CKTOTAL", "CKMB", "CKMBINDEX",  "TROPONINI" in the last 168 hours. BNP (last 3 results) No results for input(s): "PROBNP" in the last 8760 hours. HbA1C: No results for input(s): "HGBA1C" in the last 72 hours. CBG: No results for input(s): "GLUCAP" in the last 168 hours. Lipid Profile: No results for input(s): "CHOL", "HDL", "LDLCALC", "TRIG", "CHOLHDL", "LDLDIRECT" in the last 72 hours. Thyroid Function Tests: No results for input(s): "TSH", "T4TOTAL", "FREET4", "T3FREE", "THYROIDAB" in the last 72 hours. Anemia Panel: No results for input(s): "VITAMINB12", "FOLATE", "FERRITIN", "TIBC", "IRON", "RETICCTPCT" in the last 72 hours. Urine analysis: No results found for: "COLORURINE", "APPEARANCEUR", "LABSPEC", "PHURINE", "GLUCOSEU", "HGBUR", "BILIRUBINUR", "KETONESUR", "PROTEINUR", "UROBILINOGEN", "NITRITE", "LEUKOCYTESUR" Sepsis Labs: @LABRCNTIP (procalcitonin:4,lacticidven:4)  ) Recent Results (from the past 240 hours)  Surgical PCR screen     Status: None   Collection Time: 05/01/23  3:00 PM   Specimen: Nasal Mucosa; Nasal Swab  Result Value Ref Range Status   MRSA, PCR NEGATIVE NEGATIVE Final   Staphylococcus aureus NEGATIVE NEGATIVE Final    Comment: (NOTE) The Xpert SA Assay (FDA approved for NASAL specimens in patients 5 years of age and older), is one component of a comprehensive surveillance program. It is not intended to diagnose infection nor to guide or monitor treatment. Performed at [facility], [address] [place], [place] [address]          Radiology Studies: CT Hip Left Wo Contrast Result Date: 05/01/2023 CLINICAL DATA:  Left hip fracture after fall EXAM: CT OF THE LEFT HIP WITHOUT CONTRAST TECHNIQUE: Multidetector CT imaging of the left hip was performed according to the standard protocol. Multiplanar CT image reconstructions were also generated. RADIATION DOSE REDUCTION: This exam was performed according to the departmental dose-optimization program which includes automated  exposure control, adjustment of the mA and/or kV according to patient size and/or use of iterative reconstruction technique. COMPARISON:  Left hip radiographs dated 05/01/2023 at 9:54 a.m. FINDINGS: Bones/Joint/Cartilage Acute mildly impacted subcapital fracture of the left proximal femur with approximately 10 mm of anterior displacement of the posterolateral femoral neck at the level of the posterior femoral head and neck junction. The left femoral head is seated within the acetabulum. Mild-to-moderate superior joint space narrowing of the left hip. Pubic symphysis is anatomically aligned. Ligaments Ligaments are suboptimally evaluated by CT. Muscles and Tendons Muscles are normal. No muscle atrophy. No intramuscular fluid collection or hematoma. Soft tissue No fluid collection or hematoma. Partially visualized sigmoid colonic diverticulosis. Atherosclerotic vascular calcification. IMPRESSION: Acute displaced and mildly impacted subcapital fracture of the left proximal femur. Electronically Signed   By: Mannie Seek M.D.   On: 05/01/2023 11:37   DG Pelvis 1-2 Views Result Date: 05/01/2023 CLINICAL DATA:  Left hip pain after fall yesterday. EXAM: PELVIS - 1-2 VIEW COMPARISON:  None Available. FINDINGS: Moderately displaced proximal left femoral neck fracture is noted. No other bony abnormality is noted. IMPRESSION: Moderately displaced proximal left femoral neck fracture. Electronically Signed   By: Rosalene Colon M.D.   On: 05/01/2023 10:05  DG Chest Port 1 View Result Date: 05/01/2023 CLINICAL DATA:  Fall. EXAM: PORTABLE CHEST 1 VIEW COMPARISON:  None Available. FINDINGS: The heart size and mediastinal contours are within normal limits. Both lungs are clear. The visualized skeletal structures are unremarkable. IMPRESSION: No active disease. Aortic Atherosclerosis (ICD10-I70.0). Electronically Signed   By: Rosalene Colon M.D.   On: 05/01/2023 10:04   DG FEMUR MIN 2 VIEWS LEFT Result Date:  05/01/2023 CLINICAL DATA:  Left hip pain after fall yesterday. EXAM: LEFT FEMUR 2 VIEWS COMPARISON:  None Available. FINDINGS: Moderately displaced subcapital fracture of proximal left femur is noted. Remaining portion of femur is unremarkable. IMPRESSION: Moderately displaced subcapital proximal left femoral fracture. Electronically Signed   By: Rosalene Colon M.D.   On: 05/01/2023 10:03        Scheduled Meds:  amLODipine   10 mg Oral Daily   atorvastatin   80 mg Oral Daily   benazepril   20 mg Oral Daily   enoxaparin  (LOVENOX ) injection  30 mg Subcutaneous Q24H   famotidine   20 mg Oral Daily   feeding supplement  237 mL Oral BID BM   metoprolol  tartrate  12.5 mg Oral BID   multivitamin with minerals  1 tablet Oral Daily   Continuous Infusions:   ceFAZolin  (ANCEF ) IV       LOS: 1 day     Raymonde Calico, MD [facility]   If 7PM-7AM, please contact night-coverage [email or web address] Password [facility] 05/02/2023, 9:37 AM

## 2023-05-03 ENCOUNTER — Encounter: Payer: Self-pay | Admitting: Orthopedic Surgery

## 2023-05-03 DIAGNOSIS — S72002A Fracture of unspecified part of neck of left femur, initial encounter for closed fracture: Secondary | ICD-10-CM | POA: Diagnosis not present

## 2023-05-03 LAB — CBC
HCT: 34.4 % — ABNORMAL LOW (ref 36.0–46.0)
Hemoglobin: 11.3 g/dL — ABNORMAL LOW (ref 12.0–15.0)
MCH: 29 pg (ref 26.0–34.0)
MCHC: 32.8 g/dL (ref 30.0–36.0)
MCV: 88.2 fL (ref 80.0–100.0)
Platelets: 140 10*3/uL — ABNORMAL LOW (ref 150–400)
RBC: 3.9 MIL/uL (ref 3.87–5.11)
RDW: 14.7 % (ref 11.5–15.5)
WBC: 6.7 10*3/uL (ref 4.0–10.5)
nRBC: 0 % (ref 0.0–0.2)

## 2023-05-03 LAB — BASIC METABOLIC PANEL WITH GFR
Anion gap: 8 (ref 5–15)
BUN: 18 mg/dL (ref 8–23)
CO2: 25 mmol/L (ref 22–32)
Calcium: 9.2 mg/dL (ref 8.9–10.3)
Chloride: 101 mmol/L (ref 98–111)
Creatinine, Ser: 1.11 mg/dL — ABNORMAL HIGH (ref 0.44–1.00)
GFR, Estimated: 48 mL/min — ABNORMAL LOW (ref >60)
Glucose, Bld: 107 mg/dL — ABNORMAL HIGH (ref 70–99)
Potassium: 2.9 mmol/L — ABNORMAL LOW (ref 3.5–5.1)
Sodium: 134 mmol/L — ABNORMAL LOW (ref 135–145)

## 2023-05-03 LAB — MAGNESIUM: Magnesium: 1.7 mg/dL (ref 1.7–2.4)

## 2023-05-03 LAB — PHOSPHORUS: Phosphorus: 2.6 mg/dL (ref 2.5–4.6)

## 2023-05-03 LAB — VITAMIN B12: Vitamin B-12: 317 pg/mL (ref 180–914)

## 2023-05-03 MED ORDER — ENOXAPARIN SODIUM 40 MG/0.4ML IJ SOSY: 40.0000 mg | PREFILLED_SYRINGE | INTRAMUSCULAR | 0 refills | Status: AC

## 2023-05-03 MED ORDER — OXYCODONE HCL 5 MG PO TABS
2.5000 mg | ORAL_TABLET | ORAL | 0 refills | Status: AC | PRN
Start: 2023-05-03 — End: ?

## 2023-05-03 MED ORDER — POTASSIUM CHLORIDE CRYS ER 20 MEQ PO TBCR
40.0000 meq | EXTENDED_RELEASE_TABLET | ORAL | Status: AC
  Administered 2023-05-03 (×3): 40 meq via ORAL
  Filled 2023-05-03 (×3): qty 2

## 2023-05-03 NOTE — Evaluation (Signed)
 Physical Therapy Evaluation Patient Details Name: Robin Roberts MRN: [id number] DOB: 02-26-1933 Today's Date: 05/03/2023  History of Present Illness  Robin Roberts is a 89 y.o. female with medical history significant of HTN, CKD stage IIIb,, breast cancer, presented with fall and hip fracture. Patient is s/p hip pinning.  Clinical Impression  Patient received in bathroom with OT present. She is agreeable to PT assessment. Patient is able to stand from commode with cga. She is able to ambulate 125 feet with RW and cga. Narrow BOS. Reliance on RW. Patient will continue to benefit from skilled PT to improve independence and safety with mobility. She lives alone at home with no family locally and would benefit from short term SNF placement.          If plan is discharge home, recommend the following: A little help with walking and/or transfers;A little help with bathing/dressing/bathroom   Can travel by private vehicle   Yes    Equipment Recommendations None recommended by PT  Recommendations for Other Services       Functional Status Assessment Patient has had a recent decline in their functional status and demonstrates the ability to make significant improvements in function in a reasonable and predictable amount of time.     Precautions / Restrictions Precautions Precautions: Fall Restrictions Weight Bearing Restrictions Per Provider Order: Yes LLE Weight Bearing Per Provider Order: Weight bearing as tolerated      Mobility  Bed Mobility Overal bed mobility: Needs Assistance Bed Mobility: Sit to Supine       Sit to supine: Contact guard assist, Used rails   General bed mobility comments: CGA to get legs back into bed    Transfers Overall transfer level: Needs assistance Equipment used: Rolling walker (2 wheels) Transfers: Sit to/from Stand Sit to Stand: Contact guard assist                Ambulation/Gait Ambulation/Gait assistance: Contact guard assist Gait  Distance (Feet): 125 Feet Assistive device: Rolling walker (2 wheels) Gait Pattern/deviations: Step-through pattern, Decreased step length - right, Decreased step length - left, Decreased stride length, Narrow base of support Gait velocity: decr     General Gait Details: patient ambulated with RW, cga, narrow BOS  Stairs            Wheelchair Mobility     Tilt Bed    Modified Rankin (Stroke Patients Only)       Balance Overall balance assessment: Needs assistance Sitting-balance support: Feet supported Sitting balance-Leahy Scale: Good     Standing balance support: Bilateral upper extremity supported, During functional activity, Reliant on assistive device for balance Standing balance-Leahy Scale: Fair                               Pertinent Vitals/Pain Pain Assessment Pain Assessment: 0-10 Pain Score: 2  Pain Location: L hip/leg Pain Descriptors / Indicators: Discomfort Pain Intervention(s): Monitored during session, Repositioned    Home Living Family/patient expects to be discharged to:: Private residence Living Arrangements: Alone Available Help at Discharge: Family;Available PRN/intermittently   Home Access: Level entry       Home Layout: One level Home Equipment: Agricultural consultant (2 wheels)      Prior Function Prior Level of Function : Independent/Modified Independent;Driving;History of Falls (last six months)                     Extremity/Trunk Assessment  Upper Extremity Assessment Upper Extremity Assessment: Defer to OT evaluation    Lower Extremity Assessment Lower Extremity Assessment: Overall WFL for tasks assessed    Cervical / Trunk Assessment Cervical / Trunk Assessment: Normal  Communication   Communication Communication: Impaired Factors Affecting Communication: Hearing impaired    Cognition Arousal: Alert Behavior During Therapy: WFL for tasks assessed/performed   PT - Cognitive impairments: No  apparent impairments                         Following commands: Intact       Cueing Cueing Techniques: Verbal cues     General Comments      Exercises Total Joint Exercises Ankle Circles/Pumps: AROM, Both, 10 reps Gluteal Sets: AROM, Both, 10 reps Heel Slides: AROM, Left, 5 reps   Assessment/Plan    PT Assessment Patient needs continued PT services  PT Problem List Decreased strength;Decreased activity tolerance;Decreased balance;Decreased mobility;Decreased safety awareness;Pain       PT Treatment Interventions DME instruction;Gait training;Stair training;Functional mobility training;Therapeutic activities;Therapeutic exercise;Balance training;Neuromuscular re-education;Patient/family education    PT Goals (Current goals can be found in the Care Plan section)  Acute Rehab PT Goals Patient Stated Goal: to go to rehab then home PT Goal Formulation: With patient/family Time For Goal Achievement: 05/10/23 Potential to Achieve Goals: Good    Frequency BID     Co-evaluation               AM-PAC PT "6 Clicks" Mobility  Outcome Measure Help needed turning from your back to your side while in a flat bed without using bedrails?: A Little Help needed moving from lying on your back to sitting on the side of a flat bed without using bedrails?: A Little Help needed moving to and from a bed to a chair (including a wheelchair)?: A Little Help needed standing up from a chair using your arms (e.g., wheelchair or bedside chair)?: A Little Help needed to walk in hospital room?: A Little Help needed climbing 3-5 steps with a railing? : A Little 6 Click Score: 18    End of Session   Activity Tolerance: Patient limited by fatigue;Patient limited by pain Patient left: in bed;with call bell/phone within reach;with bed alarm set;with family/visitor present Nurse Communication: Mobility status PT Visit Diagnosis: Unsteadiness on feet (R26.81);Other abnormalities of gait  and mobility (R26.89);Muscle weakness (generalized) (M62.81);Difficulty in walking, not elsewhere classified (R26.2);Pain;History of falling (Z91.81) Pain - Right/Left: Left Pain - part of body: Hip;Leg    Time: 1000-1016 PT Time Calculation (min) (ACUTE ONLY): 16 min   Charges:   PT Evaluation $PT Eval Moderate Complexity: 1 Mod PT Treatments $Gait Training: 8-22 mins PT General Charges $$ ACUTE PT VISIT: 1 Visit         Kaytlen Lightsey, PT, GCS 05/03/23,10:46 AM

## 2023-05-03 NOTE — Plan of Care (Signed)
   Problem: Activity: Goal: Risk for activity intolerance will decrease Outcome: Progressing   Problem: Nutrition: Goal: Adequate nutrition will be maintained Outcome: Progressing   Problem: Coping: Goal: Level of anxiety will decrease Outcome: Progressing   Problem: Pain Managment: Goal: General experience of comfort will improve and/or be controlled Outcome: Progressing

## 2023-05-03 NOTE — Discharge Instructions (Signed)

## 2023-05-03 NOTE — Progress Notes (Signed)
 [facility] Progress Note  Patient: Robin Roberts    ZOX:[id number]  DOA: 05/01/2023     Date of Service: the patient was seen and examined on 05/03/2023  Chief Complaint  Patient presents with   Hip Pain   Brief hospital course: Robin Roberts is a 89 y.o. female with medical history significant of HTN, CKD stage IIIb,, breast cancer, presented with fall and hip fracture.   At baseline patient has a chronic ambulation impairment using rolling walker to ambulate.  Yesterday morning, patient woke up, when she tried to stand up and walk, she lost her balance and fell to the left side and hit her hip.  Yesterday, patient still able to ambulate with her walker with increasing left hip pain.  Denied any LOC, no head or neck injuries, no chest pain shortness of breath.  At baseline patient is not very active because of her ambulation status, but denies any shortness of breath or chest pain with ambulate, she lives by herself and able to complete all of her daily activities without difficulties.    Assessment and Plan:  # Left subcapital femoral neck fracture # Mechanical fall Neurovascularly intact. Vit d wnl. No anemia thus far. - bowel regimen, pain control - PT/OT consults tomorrow - outpt osteoporosis PCP f/u Orthopedic consulted, s/p percutaneous pinning of left femoral neck fracture. WBAT on the operative extremity. Lovenox  40mg /day x 4 weeks    # Hypokalemia, potassium repleted. Monitor electrolytes and replete as needed  # Urinary frequency UA: Nitrate positive, LE trace, bacteria rare, WBC 21-50 -Urine culture was not sent 5/2 follow urine culture Patient received cefazolin  2 g every 6 hourly x 3 doses for surgical prophylaxis    # HTN  Bp moderately elevated - continue home amlodipine , benazapril, statin   # CKD 3b Kidney function is at baseline - monitor   Body mass index is 24.17 kg/m.  Nutrition Problem: Severe Malnutrition Etiology: social / environmental  circumstances Interventions: Interventions: Ensure Enlive (each supplement provides 350kcal and 20 grams of protein), MVI, Liberalize Diet  Diet: Regular diet DVT Prophylaxis: Subcutaneous Lovenox    Advance goals of care discussion: Full code  Family Communication: family was present at bedside, at the time of interview.  The pt provided permission to discuss medical plan with the family. Opportunity was given to ask question and all questions were answered satisfactorily.   Disposition:  Pt is from home, admitted with left femoral neck fracture, s/p percutaneous pinning, patient has electrolyte imbalance, not a stable to discharge today.   Discharge to SNF, when bed will be available.  Subjective: No significant events overnight, left hip pain 2/10, patient denied any other complaints. Patient's daughter was at bedside, stated that she was confused last night but it is clearing up now.  No any other concerns  Physical Exam: General: NAD, lying comfortably Appear in no distress, affect appropriate Eyes: PERRLA ENT: Oral Mucosa Clear, moist  Neck: no JVD,  Cardiovascular: S1 and S2 Present, no Murmur,  Respiratory: good respiratory effort, Bilateral Air entry equal and Decreased, no Crackles, no wheezes Abdomen: Bowel Sound present, Soft and no tenderness,  Skin: no rashes Extremities: no Pedal edema, no calf tenderness, left hip honeycomb dressing intact Neurologic: without any new focal findings Gait not checked due to patient safety concerns  Vitals:   05/02/23 1539 05/02/23 1943 05/03/23 0317 05/03/23 0852  BP: 130/82 129/75 123/69 (!) 148/77  Pulse: 65 78 73 79  Resp: 18 18 16  15  Temp: (!) 97.5 F (36.4 C) (!) 97.1 F (36.2 C)  (!) 97.5 F (36.4 C)  TempSrc:  Oral  Oral  SpO2: 92% 95% 92% 96%  Weight:      Height:        Intake/Output Summary (Last 24 hours) at 05/03/2023 1418 Last data filed at 05/03/2023 1052 Gross per 24 hour  Intake 545 ml  Output --  Net 545  ml   Filed Weights   05/01/23 0925 05/02/23 1226  Weight: 70 kg 70 kg    Data Reviewed: I have personally reviewed and interpreted daily labs, tele strips, imagings as discussed above. I reviewed all nursing notes, pharmacy notes, vitals, pertinent old records I have discussed plan of care as described above with RN and patient/family.  CBC: Recent Labs  Lab 05/01/23 1002 05/02/23 0950 05/03/23 0418  WBC 9.8 9.4 6.7  NEUTROABS 8.8*  --   --   HGB 12.5 13.4 11.3*  HCT 37.8 40.2 34.4*  MCV 88.7 88.0 88.2  PLT 158 169 140*   Basic Metabolic Panel: Recent Labs  Lab 05/01/23 1002 05/02/23 0950 05/03/23 0418  NA 137 137 134*  K 3.3* 3.3* 2.9*  CL 106 106 101  CO2 25 24 25   GLUCOSE 118* 117* 107*  BUN 16 17 18   CREATININE 1.46* 1.22* 1.11*  CALCIUM  9.6 9.8 9.2  MG  --   --  1.7  PHOS  --   --  2.6    Studies: No results found.  Scheduled Meds:  acetaminophen   1,000 mg Oral Q8H   amLODipine   10 mg Oral Daily   atorvastatin   40 mg Oral Daily   benazepril   20 mg Oral Daily   docusate sodium   100 mg Oral BID   enoxaparin  (LOVENOX ) injection  40 mg Subcutaneous Q24H   famotidine   20 mg Oral Daily   feeding supplement  237 mL Oral BID BM   multivitamin with minerals  1 tablet Oral Daily   potassium chloride   40 mEq Oral Q4H   Continuous Infusions: PRN Meds: bisacodyl , HYDROmorphone  (DILAUDID ) injection, loratadine , methocarbamol  **OR** methocarbamol  (ROBAXIN ) injection, metoCLOPramide  **OR** metoCLOPramide  (REGLAN ) injection, ondansetron  **OR** ondansetron  (ZOFRAN ) IV, oxyCODONE , oxyCODONE , senna-docusate, sodium phosphate   Time spent: 55 minutes  Author: Althia Atlas. MD [facility] 05/03/2023 2:18 PM  To reach On-call, see care teams to locate the attending and reach out to them via [email or web address]. If 7PM-7AM, please contact night-coverage If you still have difficulty reaching the attending provider, please page the [facility] (Director on Call) for [facility] on amion for assistance.

## 2023-05-03 NOTE — Evaluation (Signed)
 Occupational Therapy Evaluation Patient Details Name: Robin Roberts MRN: [id number] DOB: Apr 15, 1933 Today's Date: 05/03/2023   History of Present Illness   Robin Roberts is a 89 y.o. female with medical history significant of HTN, CKD stage IIIb,, breast cancer, presented with fall and hip fracture. Patient is s/p hip pinning.     Clinical Impressions Patient presenting with decreased Ind in self care,balance, functional mobility/transfers, endurance, and safety awareness. Patient reports living at home alone and independently. She drives to grocery store and appointments. Patient currently functioning at supervision - steady assist with use of RW for mobility and toileting needs. Pt thread clothing onto B feet with CGA for balance. She does not have any family or friends that can provide assistance at discharge.  Patient will benefit from acute OT to increase overall independence in the areas of ADLs, functional mobility,and safety awareness in order to safely discharge.   If plan is discharge home, recommend the following:   A little help with walking and/or transfers;A little help with bathing/dressing/bathroom;Assistance with cooking/housework;Assist for transportation     Functional Status Assessment   Patient has had a recent decline in their functional status and demonstrates the ability to make significant improvements in function in a reasonable and predictable amount of time.     Equipment Recommendations   None recommended by OT      Precautions/Restrictions   Precautions Precautions: Fall Restrictions Weight Bearing Restrictions Per Provider Order: Yes LLE Weight Bearing Per Provider Order: Weight bearing as tolerated     Mobility Bed Mobility Overal bed mobility: Needs Assistance Bed Mobility: Supine to Sit     Supine to sit: Contact guard          Transfers Overall transfer level: Needs assistance Equipment used: Rolling walker (2  wheels) Transfers: Sit to/from Stand Sit to Stand: Contact guard assist                  Balance Overall balance assessment: Needs assistance Sitting-balance support: Feet supported Sitting balance-Leahy Scale: Good     Standing balance support: Bilateral upper extremity supported, During functional activity, Reliant on assistive device for balance Standing balance-Leahy Scale: Fair                             ADL either performed or assessed with clinical judgement   ADL Overall ADL's : Needs assistance/impaired                         Toilet Transfer: Contact guard assist;Comfort height toilet;Rolling walker (2 wheels)   Toileting- Clothing Manipulation and Hygiene: Contact guard assist;Sit to/from stand       Functional mobility during ADLs: Supervision/safety       Vision Patient Visual Report: No change from baseline              Pertinent Vitals/Pain Pain Assessment Pain Assessment: No/denies pain Pain Score: 2  Pain Location: L hip/leg Pain Descriptors / Indicators: Discomfort Pain Intervention(s): Monitored during session, Repositioned     Extremity/Trunk Assessment Upper Extremity Assessment Upper Extremity Assessment: Overall WFL for tasks assessed   Lower Extremity Assessment Lower Extremity Assessment: Overall WFL for tasks assessed   Cervical / Trunk Assessment Cervical / Trunk Assessment: Normal   Communication Communication Communication: Impaired Factors Affecting Communication: Hearing impaired   Cognition Arousal: Alert Behavior During Therapy: WFL for tasks assessed/performed  Following commands: Intact       Cueing  General Comments   Cueing Techniques: Verbal cues              Home Living Family/patient expects to be discharged to:: Private residence Living Arrangements: Alone Available Help at Discharge: Family;Available PRN/intermittently    Home Access: Level entry     Home Layout: One level               Home Equipment: Agricultural consultant (2 wheels)          Prior Functioning/Environment Prior Level of Function : Independent/Modified Independent;Driving;History of Falls (last six months)                    OT Problem List: Decreased strength;Decreased activity tolerance;Decreased safety awareness;Impaired balance (sitting and/or standing);Decreased knowledge of use of DME or AE   OT Treatment/Interventions: Self-care/ADL training;Therapeutic exercise;Therapeutic activities;Energy conservation;DME and/or AE instruction;Patient/family education;Balance training      OT Goals(Current goals can be found in the care plan section)   Acute Rehab OT Goals Patient Stated Goal: to get stronger and go home OT Goal Formulation: With patient Time For Goal Achievement: 05/17/23 Potential to Achieve Goals: Fair ADL Goals Pt Will Perform Grooming: with modified independence;standing Pt Will Perform Lower Body Dressing: with modified independence;sit to/from stand Pt Will Transfer to Toilet: with modified independence;ambulating Pt Will Perform Toileting - Clothing Manipulation and hygiene: with modified independence;sit to/from stand   OT Frequency:  Min 2X/week       AM-PAC OT "6 Clicks" Daily Activity     Outcome Measure Help from another person eating meals?: None Help from another person taking care of personal grooming?: A Little Help from another person toileting, which includes using toliet, bedpan, or urinal?: A Little Help from another person bathing (including washing, rinsing, drying)?: A Little Help from another person to put on and taking off regular upper body clothing?: None Help from another person to put on and taking off regular lower body clothing?: A Little 6 Click Score: 20   End of Session Equipment Utilized During Treatment: Rolling walker (2 wheels) Nurse Communication: Mobility  status  Activity Tolerance: Patient tolerated treatment well Patient left: in bed;with call bell/phone within reach;with bed alarm set  OT Visit Diagnosis: Unsteadiness on feet (R26.81);Repeated falls (R29.6);Muscle weakness (generalized) (M62.81)                Time: 1191-4782 OT Time Calculation (min): 22 min Charges:  OT General Charges $OT Visit: 1 Visit OT Evaluation $OT Eval Low Complexity: 1 Low OT Treatments $Self Care/Home Management : 8-22 mins George Kinder, MS, OTR/L , CBIS ascom [phone]  05/03/23, 1:40 PM

## 2023-05-03 NOTE — Progress Notes (Signed)
 Physical Therapy Treatment Patient Details Name: Robin Roberts MRN: [id number] DOB: [birth year] Today's Date: 05/03/2023   History of Present Illness Robin Roberts is a 89 y.o. female with medical history significant of HTN, CKD stage IIIb,, breast cancer, presented with fall and hip fracture. Patient is s/p hip pinning.    PT Comments  Patient received in bed, daughter at bedside. Patient is agreeable to PT session. She reports no pain at rest. Patient is motivated and can perform supine to sit mod I. She requires cues for hand placement for safe transfers, ambulated 150 feet with RW and cga. Patient seems to have some increased confusion this pm compared to am. Daughter also states patient has been confused and found to have UTI. She will continue to benefit from skilled PT to improve functional independence and safety with mobility.       If plan is discharge home, recommend the following: A little help with walking and/or transfers;A little help with bathing/dressing/bathroom;Assistance with cooking/housework;Direct supervision/assist for financial management;Direct supervision/assist for medications management   Can travel by private vehicle     Yes  Equipment Recommendations  None recommended by PT    Recommendations for Other Services       Precautions / Restrictions Precautions Precautions: Fall Recall of Precautions/Restrictions: Impaired Restrictions Weight Bearing Restrictions Per Provider Order: Yes LLE Weight Bearing Per Provider Order: Weight bearing as tolerated     Mobility  Bed Mobility Overal bed mobility: Needs Assistance Bed Mobility: Supine to Sit, Sit to Supine     Supine to sit: Modified independent (Device/Increase time) Sit to supine: Contact guard assist   General bed mobility comments: CGA to get legs back into bed    Transfers Overall transfer level: Needs assistance Equipment used: Rolling walker (2 wheels) Transfers: Sit to/from Stand Sit to  Stand: Contact guard assist           General transfer comment: cues for safe hand placement    Ambulation/Gait Ambulation/Gait assistance: Contact guard assist Gait Distance (Feet): 150 Feet Assistive device: Rolling walker (2 wheels) Gait Pattern/deviations: Step-through pattern, Decreased step length - right, Decreased step length - left, Decreased stride length, Narrow base of support Gait velocity: decr     General Gait Details: patient ambulated with RW, Narrow BOS, no lob or significant difficulty. Some mild pain reported   Stairs             Wheelchair Mobility     Tilt Bed    Modified Rankin (Stroke Patients Only)       Balance Overall balance assessment: Needs assistance Sitting-balance support: Feet supported Sitting balance-Leahy Scale: Good     Standing balance support: Bilateral upper extremity supported, During functional activity, Reliant on assistive device for balance Standing balance-Leahy Scale: Good Standing balance comment: able to statically stand without UE support                            Communication Communication Communication: Impaired Factors Affecting Communication: Hearing impaired  Cognition Arousal: Alert Behavior During Therapy: WFL for tasks assessed/performed   PT - Cognitive impairments: Memory, Problem solving, Safety/Judgement, Awareness                         Following commands: Intact      Cueing Cueing Techniques: Verbal cues  Exercises Total Joint Exercises Ankle Circles/Pumps: AROM, Both, 10 reps Gluteal Sets: AROM, Both, 10 reps Heel Slides:  AROM, Left, 5 reps    General Comments        Pertinent Vitals/Pain Pain Assessment Pain Assessment: 0-10 Pain Score: 4  Pain Location: L hip/leg Pain Descriptors / Indicators: Discomfort, Sore Pain Intervention(s): Monitored during session, Repositioned    Home Living Family/patient expects to be discharged to:: Private  residence Living Arrangements: Alone Available Help at Discharge: Family;Available PRN/intermittently   Home Access: Level entry       Home Layout: One level Home Equipment: Agricultural consultant (2 wheels)      Prior Function            PT Goals (current goals can now be found in the care plan section) Acute Rehab PT Goals Patient Stated Goal: to go to rehab PT Goal Formulation: With patient/family Time For Goal Achievement: 05/10/23 Potential to Achieve Goals: Good Progress towards PT goals: Progressing toward goals    Frequency    BID      PT Plan      Co-evaluation              AM-PAC PT "6 Clicks" Mobility   Outcome Measure  Help needed turning from your back to your side while in a flat bed without using bedrails?: A Little Help needed moving from lying on your back to sitting on the side of a flat bed without using bedrails?: A Little Help needed moving to and from a bed to a chair (including a wheelchair)?: A Little Help needed standing up from a chair using your arms (e.g., wheelchair or bedside chair)?: A Little Help needed to walk in hospital room?: A Little Help needed climbing 3-5 steps with a railing? : A Little 6 Click Score: 18    End of Session Equipment Utilized During Treatment: Gait belt Activity Tolerance: Patient tolerated treatment well Patient left: in bed;with call bell/phone within reach;with bed alarm set;with family/visitor present Nurse Communication: Mobility status PT Visit Diagnosis: Unsteadiness on feet (R26.81);Other abnormalities of gait and mobility (R26.89);Muscle weakness (generalized) (M62.81);Difficulty in walking, not elsewhere classified (R26.2);Pain;History of falling (Z91.81) Pain - Right/Left: Left Pain - part of body: Hip;Leg     Time: 1343-1414 PT Time Calculation (min) (ACUTE ONLY): 31 min  Charges:    $Gait Training: 8-22 mins $Therapeutic Activity: 8-22 mins PT General Charges $$ ACUTE PT VISIT: 1  Visit                     Emmilynn Marut, PT, GCS 05/03/23,2:25 PM

## 2023-05-03 NOTE — Progress Notes (Signed)
  Subjective: 1 Day Post-Op Procedure(s) (LRB): FIXATION, FEMUR, NECK, PERCUTANEOUS, USING SCREW (Left) Patient reports pain as mild.   Patient is well, and has had no acute complaints or problems up to the bedside commode. Plan is to go home versus Rehab after hospital stay. Negative for chest pain and shortness of breath Fever: no Gastrointestinal: Negative for nausea and vomiting  Objective: Vital signs in last 24 hours: Temp:  [97.1 F (36.2 C)-98.7 F (37.1 C)] 97.1 F (36.2 C) (05/01 1943) Pulse Rate:  [63-92] 73 (05/02 0317) Resp:  [10-18] 16 (05/02 0317) BP: (123-158)/(69-99) 123/69 (05/02 0317) SpO2:  [92 %-98 %] 92 % (05/02 0317) Weight:  [70 kg] 70 kg (05/01 1226)  Intake/Output from previous day:  Intake/Output Summary (Last 24 hours) at 05/03/2023 0718 Last data filed at 05/03/2023 0421 Gross per 24 hour  Intake 525 ml  Output 20 ml  Net 505 ml    Intake/Output this shift: No intake/output data recorded.  Labs: Recent Labs    05/01/23 1002 05/02/23 0950 05/03/23 0418  HGB 12.5 13.4 11.3*   Recent Labs    05/02/23 0950 05/03/23 0418  WBC 9.4 6.7  RBC 4.57 3.90  HCT 40.2 34.4*  PLT 169 140*   Recent Labs    05/02/23 0950 05/03/23 0418  NA 137 134*  K 3.3* 2.9*  CL 106 101  CO2 24 25  BUN 17 18  CREATININE 1.22* 1.11*  GLUCOSE 117* 107*  CALCIUM  9.8 9.2   Recent Labs    05/01/23 1002  INR 1.1     EXAM General - Patient is alert and mildly confused. Extremity - Sensation intact distally Dorsiflexion/Plantar flexion intact Compartment soft Dressing/Incision - clean, dry, no drainage Motor Function - intact, moving foot and toes well on exam.  Ambulated to the bathroom with 1 assist and her walker.  Past Medical History:  Diagnosis Date   Actinic keratosis    Basal cell carcinoma    Breast cancer (HCC)    Cancer (HCC)    skin   Cancer (HCC) right   breast cancer 2012   Coronary artery disease    GERD (gastroesophageal reflux  disease)    Hypertension    Personal history of radiation therapy 2012   Squamous cell carcinoma of skin 10/15/2017   Right plantar foot. [facility] SCC   Squamous cell carcinoma of skin 07/11/2022   left 5th DIP, m[facility]s [facility] 08/30/22    Assessment/Plan: 1 Day Post-Op Procedure(s) (LRB): FIXATION, FEMUR, NECK, PERCUTANEOUS, USING SCREW (Left) Principal Problem:   Hip fracture (HCC) Active Problems:   Femoral neck fracture (HCC)   Protein-calorie malnutrition, severe   Hypertension   CKD stage 3b, GFR 30-44 ml/min (HCC)  Estimated body mass index is 24.17 kg/m as calculated from the following:   Height as of this encounter: 5\' 7"  (1.702 m).   Weight as of this encounter: 70 kg. Advance diet Up with therapy  DVT Prophylaxis - Lovenox  and TED hose Weight-Bearing as tolerated to left leg  Bert Britain, PA-C Orthopaedic Surgery 05/03/2023, 7:18 AM

## 2023-05-04 DIAGNOSIS — S72002A Fracture of unspecified part of neck of left femur, initial encounter for closed fracture: Secondary | ICD-10-CM | POA: Diagnosis not present

## 2023-05-04 LAB — CBC
HCT: 33.2 % — ABNORMAL LOW (ref 36.0–46.0)
Hemoglobin: 10.9 g/dL — ABNORMAL LOW (ref 12.0–15.0)
MCH: 28.8 pg (ref 26.0–34.0)
MCHC: 32.8 g/dL (ref 30.0–36.0)
MCV: 87.8 fL (ref 80.0–100.0)
Platelets: 171 10*3/uL (ref 150–400)
RBC: 3.78 MIL/uL — ABNORMAL LOW (ref 3.87–5.11)
RDW: 14.6 % (ref 11.5–15.5)
WBC: 6.8 10*3/uL (ref 4.0–10.5)
nRBC: 0 % (ref 0.0–0.2)

## 2023-05-04 LAB — PHOSPHORUS: Phosphorus: 1.8 mg/dL — ABNORMAL LOW (ref 2.5–4.6)

## 2023-05-04 LAB — BASIC METABOLIC PANEL WITH GFR
Anion gap: 9 (ref 5–15)
Anion gap: 8 (ref 5–15)
BUN: 22 mg/dL (ref 8–23)
BUN: 23 mg/dL (ref 8–23)
CO2: 24 mmol/L (ref 22–32)
CO2: 23 mmol/L (ref 22–32)
Calcium: 9.3 mg/dL (ref 8.9–10.3)
Calcium: 9.6 mg/dL (ref 8.9–10.3)
Chloride: 101 mmol/L (ref 98–111)
Chloride: 104 mmol/L (ref 98–111)
Creatinine, Ser: 1.3 mg/dL — ABNORMAL HIGH (ref 0.44–1.00)
Creatinine, Ser: 1.52 mg/dL — ABNORMAL HIGH (ref 0.44–1.00)
GFR, Estimated: 40 mL/min — ABNORMAL LOW (ref >60)
GFR, Estimated: 33 mL/min — ABNORMAL LOW (ref >60)
Glucose, Bld: 81 mg/dL (ref 70–99)
Glucose, Bld: 111 mg/dL — ABNORMAL HIGH (ref 70–99)
Potassium: 6 mmol/L — ABNORMAL HIGH (ref 3.5–5.1)
Potassium: 5.4 mmol/L — ABNORMAL HIGH (ref 3.5–5.1)
Sodium: 134 mmol/L — ABNORMAL LOW (ref 135–145)
Sodium: 135 mmol/L (ref 135–145)

## 2023-05-04 LAB — MAGNESIUM: Magnesium: 1.7 mg/dL (ref 1.7–2.4)

## 2023-05-04 MED ORDER — CYANOCOBALAMIN 1000 MCG/ML IJ SOLN
1000.0000 ug | Freq: Every day | INTRAMUSCULAR | Status: DC
  Administered 2023-05-04 – 2023-05-05 (×2): 1000 ug via INTRAMUSCULAR
  Filled 2023-05-04 (×2): qty 1

## 2023-05-04 MED ORDER — SODIUM ZIRCONIUM CYCLOSILICATE 10 G PO PACK
10.0000 g | PACK | Freq: Once | ORAL | Status: AC
Start: 2023-05-04 — End: 2023-05-04
  Administered 2023-05-04: 10 g via ORAL
  Filled 2023-05-04: qty 1

## 2023-05-04 MED ORDER — VITAMIN B-12 1000 MCG PO TABS: 1000.0000 ug | ORAL_TABLET | Freq: Every day | ORAL | Status: DC

## 2023-05-04 MED ORDER — SODIUM PHOSPHATES 45 MMOLE/15ML IV SOLN
30.0000 mmol | Freq: Once | INTRAVENOUS | Status: AC
  Administered 2023-05-04: 30 mmol via INTRAVENOUS
  Filled 2023-05-04: qty 10

## 2023-05-04 NOTE — Progress Notes (Signed)
 Continued education with daughter regarding safely assisting pt with mobility in order to help care for her mom at d/c. Pt increased gait distance with RW around nursing station. Slight increased L hip discomfort with mobility, no LOB or unsteadiness noted. Will check to see if stair training needs to be completed tomorrow.  05/04/23 1700  PT Visit Information  Assistance Needed +1  History of Present Illness Robin Roberts is a 89 y.o. female with medical history significant of HTN, CKD stage IIIb,, breast cancer, presented with fall and hip fracture. Patient is s/p hip pinning.  Subjective Data  Patient Stated Goal Get stronger  Precautions  Precautions Fall  Recall of Precautions/Restrictions Intact  Restrictions  Weight Bearing Restrictions Per Provider Order Yes  LLE Weight Bearing Per Provider Order WBAT  Pain Assessment  Pain Assessment 0-10  Pain Score 5  Pain Location L hip/leg  Pain Descriptors / Indicators Discomfort;Sore  Pain Intervention(s) Patient requesting pain meds-RN notified  Cognition  Arousal Alert  Behavior During Therapy WFL for tasks assessed/performed  PT - Cognitive impairments Memory;Problem solving;Safety/Judgement;Awareness  PT - Cognition Comments Cognition improving  Following Commands  Following commands Intact  Cueing  Cueing Techniques Verbal cues  Communication  Communication Impaired  Factors Affecting Communication Hearing impaired  Bed Mobility  Overal bed mobility Needs Assistance  Bed Mobility Supine to Sit;Sit to Supine  Supine to sit Contact guard;HOB elevated  Sit to supine Contact guard assist  General bed mobility comments CGA to get legs back into bed  Transfers  Overall transfer level Needs assistance  Equipment used Rolling walker (2 wheels)  Transfers Sit to/from Stand  Sit to Stand Contact guard assist  General transfer comment Cues for hand placement and technique, pt able to stand without physical assist and slight initial  struggle  Ambulation/Gait  Ambulation/Gait assistance Contact guard assist  Gait Distance (Feet) 160 Feet  Assistive device Rolling walker (2 wheels)  Gait Pattern/deviations Step-through pattern;Decreased step length - right;Decreased step length - left;Decreased stride length;Narrow base of support  General Gait Details Daughter present for gait training, educated on safety awareness, use of gait belt  Gait velocity decr  Stairs  (Will need to practice)  Balance  Overall balance assessment Needs assistance  Sitting-balance support Feet supported  Sitting balance-Leahy Scale Good  Standing balance support Bilateral upper extremity supported;During functional activity;Reliant on assistive device for balance  Standing balance-Leahy Scale Fair  Standing balance comment CGA for gait with RW  Exercises  Exercises Other exercises  Other Exercises  Other Exercises Pt given hip fracture exercise packet, Info on stair and car transfers  Other Exercises Continued education with daughter and pt regarding safe mobility and assisting pt for safe transition to daughter's home  PT - End of Session  Equipment Utilized During Treatment Gait belt  Activity Tolerance Patient tolerated treatment well  Patient left in bed;with call bell/phone within reach;with bed alarm set;with family/visitor present  Nurse Communication Mobility status;Patient requests pain meds   PT - Assessment/Plan  PT Visit Diagnosis Unsteadiness on feet (R26.81);Other abnormalities of gait and mobility (R26.89);Muscle weakness (generalized) (M62.81);Difficulty in walking, not elsewhere classified (R26.2);Pain;History of falling (Z91.81)  Pain - Right/Left Left  Pain - part of body Hip;Leg  PT Frequency (ACUTE ONLY) BID  Follow Up Recommendations Skilled nursing-short term rehab (<3 hours/day) (Pt's daughter will be taking pt home to her house with HHPT/OT/aid instead of STR)  Can patient physically be transported by private  vehicle Yes  Patient can return  home with the following A little help with walking and/or transfers;A little help with bathing/dressing/bathroom;Assistance with cooking/housework;Direct supervision/assist for financial management;Direct supervision/assist for medications management  PT equipment Other (comment) (TBD pending DISPO)  AM-PAC PT "6 Clicks" Mobility Outcome Measure (Version 2)  Help needed turning from your back to your side while in a flat bed without using bedrails? 3  Help needed moving from lying on your back to sitting on the side of a flat bed without using bedrails? 3  Help needed moving to and from a bed to a chair (including a wheelchair)? 3  Help needed standing up from a chair using your arms (e.g., wheelchair or bedside chair)? 3  Help needed to walk in hospital room? 3  Help needed climbing 3-5 steps with a railing?  3  6 Click Score 18  Consider Recommendation of Discharge To: Home with [facility]  Progressive Mobility  What is the highest level of mobility based on the progressive mobility assessment? Level 5 (Walks with assist in room/hall) - Balance while stepping forward/back and can walk in room with assist - Complete  Activity Ambulated with assistance in hallway  PT Goal Progression  Progress towards PT goals Progressing toward goals  PT Time Calculation  PT Start Time (ACUTE ONLY) 1431  PT Stop Time (ACUTE ONLY) 1458  PT Time Calculation (min) (ACUTE ONLY) 27 min  PT General Charges  $$ ACUTE PT VISIT 1 Visit  PT Treatments  $Gait Training 8-22 mins  $Therapeutic Activity 8-22 mins  Melvyn Stagers, PTA 05/04/2023

## 2023-05-04 NOTE — Plan of Care (Signed)

## 2023-05-04 NOTE — Progress Notes (Signed)
 Subjective: 2 Days Post-Op Procedure(s) (LRB): FIXATION, FEMUR, NECK, PERCUTANEOUS, USING SCREW (Left) Patient reports pain as mild.   Patient is well, and has had no acute complaints or problems.  She is sitting up at the edge of bed with PT during provider visit. Plan is to go home versus Rehab after hospital stay.  Discussed this at length with patient and daughter. Negative for chest pain and shortness of breath Fever: no Gastrointestinal: Negative for nausea and vomiting  Objective: Vital signs in last 24 hours: Temp:  [97.5 F (36.4 C)-98 F (36.7 C)] 97.6 F (36.4 C) (05/03 0813) Pulse Rate:  [70-91] 79 (05/03 0813) Resp:  [15-16] 15 (05/03 0813) BP: (128-141)/(73-79) 141/79 (05/03 0813) SpO2:  [97 %-98 %] 98 % (05/03 0813)  Intake/Output from previous day:  Intake/Output Summary (Last 24 hours) at 05/04/2023 1117 Last data filed at 05/04/2023 0924 Gross per 24 hour  Intake 490 ml  Output --  Net 490 ml    Intake/Output this shift: Total I/O In: 250 [P.O.:250] Out: -   Labs: Recent Labs    05/02/23 0950 05/03/23 0418 05/04/23 0454  HGB 13.4 11.3* 10.9*   Recent Labs    05/03/23 0418 05/04/23 0454  WBC 6.7 6.8  RBC 3.90 3.78*  HCT 34.4* 33.2*  PLT 140* 171   Recent Labs    05/04/23 0454 05/04/23 1014  NA 134* 135  K 6.0* 5.4*  CL 101 104  CO2 24 23  BUN 22 23  CREATININE 1.30* 1.52*  GLUCOSE 81 111*  CALCIUM  9.3 9.6   No results for input(s): "LABPT", "INR" in the last 72 hours.    EXAM General - Patient is alert and mildly confused. Extremity - Sensation intact distally Dorsiflexion/Plantar flexion intact Compartment soft Dressing/Incision - clean, dry, no drainage Motor Function - intact, moving foot and toes well on exam.  Ambulated to the bathroom with 1 assist and her walker.  Past Medical History:  Diagnosis Date   Actinic keratosis    Basal cell carcinoma    Breast cancer (HCC)    Cancer (HCC)    skin   Cancer (HCC) right    breast cancer 2012   Coronary artery disease    GERD (gastroesophageal reflux disease)    Hypertension    Personal history of radiation therapy 2012   Squamous cell carcinoma of skin 10/15/2017   Right plantar foot. [facility] SCC   Squamous cell carcinoma of skin 07/11/2022   left 5th DIP, mohs at [facility] 08/30/22    Assessment/Plan: 2 Days Post-Op Procedure(s) (LRB): FIXATION, FEMUR, NECK, PERCUTANEOUS, USING SCREW (Left) Principal Problem:   Hip fracture (HCC) Active Problems:   Femoral neck fracture (HCC)   Protein-calorie malnutrition, severe   Hypertension   CKD stage 3b, GFR 30-44 ml/min (HCC)  Estimated body mass index is 24.17 kg/m as calculated from the following:   Height as of this encounter: 5\' 7"  (1.702 m).   Weight as of this encounter: 70 kg. Advance diet Up with therapy VSS Labs stable, hemoglobin at 10.9 Patient was seen during her PT visit.  Discussed with the patient and her daughter the options regarding skilled nursing facility versus home with home health PT.  Patient and daughter stated they would like to see her relative function with physical therapy before making any decision.  Discussed with PT who states they believe she would do better with at least 1 more day of working with physical therapy, before deciding any potential discharge home  DVT Prophylaxis - Lovenox  and TED hose Weight-Bearing as tolerated to left leg  Standley Earing, PA-C Orthopaedic Surgery 05/04/2023, 11:17 AM

## 2023-05-04 NOTE — Progress Notes (Signed)
 Physical Therapy Treatment Patient Details Name: Robin Roberts MRN: [id number] DOB: 09-Aug-1933 Today's Date: 05/04/2023   History of Present Illness Robin Roberts is a 89 y.o. female with medical history significant of HTN, CKD stage IIIb,, breast cancer, presented with fall and hip fracture. Patient is s/p hip pinning.    PT Comments  Pt received in bed, daughter at bedside. Discussed role of PT and current functional goals. Lengthly conversation with pt and daughter regarding pt's current LOF, potential needs at d/c, and recovery expectations. Overall, pt's functional level continues to improve requiring primarily CGA for bed mobility, transfers, and gait with RW. Pt's daughter is currently leaning towards taking pt home with her to recover with home health services if progression continues. Therefore significant education provided in safely assisting pt with mobility and prevention of falls. Will continue to progress towards set goals and education this pm.   If plan is discharge home, recommend the following: A little help with walking and/or transfers;A little help with bathing/dressing/bathroom;Assistance with cooking/housework;Direct supervision/assist for financial management;Direct supervision/assist for medications management   Can travel by private vehicle     Yes  Equipment Recommendations  Other (comment) (TBD depending on d/c plans)    Recommendations for Other Services       Precautions / Restrictions Precautions Precautions: Fall Recall of Precautions/Restrictions: Intact Restrictions Weight Bearing Restrictions Per Provider Order: Yes LLE Weight Bearing Per Provider Order: Weight bearing as tolerated     Mobility  Bed Mobility Overal bed mobility: Needs Assistance Bed Mobility: Supine to Sit, Sit to Supine     Supine to sit: Contact guard, HOB elevated Sit to supine: Contact guard assist   General bed mobility comments: CGA to get legs back into bed     Transfers Overall transfer level: Needs assistance Equipment used: Rolling walker (2 wheels) Transfers: Sit to/from Stand Sit to Stand: Contact guard assist           General transfer comment: Cues for hand placement and technique, pt able to stand without physical assist and slight initial struggle    Ambulation/Gait Ambulation/Gait assistance: Contact guard assist Gait Distance (Feet): 45 Feet Assistive device: Rolling walker (2 wheels) Gait Pattern/deviations: Step-through pattern, Decreased step length - right, Decreased step length - left, Decreased stride length, Narrow base of support Gait velocity: decr     General Gait Details: patient ambulated with RW, Narrow BOS, no lob or significant difficulty. Some mild pain reported   Stairs             Wheelchair Mobility     Tilt Bed    Modified Rankin (Stroke Patients Only)       Balance Overall balance assessment: Needs assistance Sitting-balance support: Feet supported Sitting balance-Leahy Scale: Good     Standing balance support: Bilateral upper extremity supported, During functional activity, Reliant on assistive device for balance Standing balance-Leahy Scale: Good Standing balance comment: able to statically stand without UE support                            Communication Communication Communication: Impaired Factors Affecting Communication: Hearing impaired  Cognition Arousal: Alert Behavior During Therapy: WFL for tasks assessed/performed                           PT - Cognition Comments: Pt's cognition has improved from previous day Following commands: Intact      Cueing Cueing  Techniques: Verbal cues  Exercises Total Joint Exercises Ankle Circles/Pumps: AROM, Both, 10 reps Heel Slides: AROM, Left, 5 reps Other Exercises Other Exercises: Lengthly conversation with pt and daughter regarding role of PT, POC, d/c options, pt's current LOF. All questions and  concerns addressed    General Comments General comments (skin integrity, edema, etc.): L lateral hip dressing intact      Pertinent Vitals/Pain Pain Assessment Pain Assessment: Faces Faces Pain Scale: Hurts a little bit Pain Location: L hip/leg Pain Descriptors / Indicators: Discomfort, Sore Pain Intervention(s): Monitored during session    Home Living                          Prior Function            PT Goals (current goals can now be found in the care plan section) Acute Rehab PT Goals Patient Stated Goal: to go to rehab Progress towards PT goals: Progressing toward goals    Frequency    BID      PT Plan      Co-evaluation              AM-PAC PT "6 Clicks" Mobility   Outcome Measure  Help needed turning from your back to your side while in a flat bed without using bedrails?: A Little Help needed moving from lying on your back to sitting on the side of a flat bed without using bedrails?: A Little Help needed moving to and from a bed to a chair (including a wheelchair)?: A Little Help needed standing up from a chair using your arms (e.g., wheelchair or bedside chair)?: A Little Help needed to walk in hospital room?: A Little Help needed climbing 3-5 steps with a railing? : A Little 6 Click Score: 18    End of Session Equipment Utilized During Treatment: Gait belt Activity Tolerance: Patient tolerated treatment well Patient left: in bed;with call bell/phone within reach;with bed alarm set;with family/visitor present Nurse Communication: Mobility status PT Visit Diagnosis: Unsteadiness on feet (R26.81);Other abnormalities of gait and mobility (R26.89);Muscle weakness (generalized) (M62.81);Difficulty in walking, not elsewhere classified (R26.2);Pain;History of falling (Z91.81) Pain - Right/Left: Left Pain - part of body: Hip;Leg     Time: 8295-6213 PT Time Calculation (min) (ACUTE ONLY): 54 min  Charges:    $Gait Training: 8-22  mins $Therapeutic Exercise: 8-22 mins $Therapeutic Activity: 23-37 mins PT General Charges $$ ACUTE PT VISIT: 1 Visit                    Melvyn Stagers, PTA  Robin Roberts 05/04/2023, 1:51 PM

## 2023-05-04 NOTE — Progress Notes (Signed)
 [facility] Progress Note  Patient: Robin Roberts    ZOX:[id number]  DOA: 05/01/2023     Date of Service: the patient was seen and examined on 05/04/2023  Chief Complaint  Patient presents with   Hip Pain   Brief hospital course: MCKINNEY GLASS is a 89 y.o. female with medical history significant of HTN, CKD stage IIIb,, breast cancer, presented with fall and hip fracture.   At baseline patient has a chronic ambulation impairment using rolling walker to ambulate.  Yesterday morning, patient woke up, when she tried to stand up and walk, she lost her balance and fell to the left side and hit her hip.  Yesterday, patient still able to ambulate with her walker with increasing left hip pain.  Denied any LOC, no head or neck injuries, no chest pain shortness of breath.  At baseline patient is not very active because of her ambulation status, but denies any shortness of breath or chest pain with ambulate, she lives by herself and able to complete all of her daily activities without difficulties.    Assessment and Plan:  # Left subcapital femoral neck fracture # Mechanical fall Neurovascularly intact. Vit d wnl. No anemia thus far. - bowel regimen, pain control - PT/OT consults tomorrow - outpt osteoporosis PCP f/u Orthopedic consulted, s/p percutaneous pinning of left femoral neck fracture. WBAT on the operative extremity. Lovenox  40mg /day x 4 weeks    # Hypokalemia, potassium repleted. 5/3 developed hyperkalemia, could be due to lab error reported on 5/2 and excessive potassium replacement was given. Potassium 5.4, Lokelma 10 g one-time dose given  # Hypophosphatemia, Phos repleted. Monitor electrolytes and replete as needed  # Urinary frequency UA: Nitrate positive, LE trace, bacteria rare, WBC 21-50 -Urine culture was not sent 5/2 follow urine culture Patient received cefazolin  2 g every 6 hourly x 3 doses for surgical prophylaxis    # HTN  Bp moderately elevated - continue  home amlodipine , benazapril, statin   # CKD 3b Kidney function is at baseline Encourage fluids - monitor   # Vitamin B12 level 317, goal >400.  Started vitamin B12 1000 mcg IM injection during hospital stay followed by oral supplement.  Follow with PCP to repeat B12 level after 3 to 6 months.   Body mass index is 24.17 kg/m.  Nutrition Problem: Severe Malnutrition Etiology: social / environmental circumstances Interventions: Interventions: Ensure Enlive (each supplement provides 350kcal and 20 grams of protein), MVI, Liberalize Diet  Diet: Regular diet DVT Prophylaxis: Subcutaneous Lovenox    Advance goals of care discussion: Full code  Family Communication: family was present at bedside, at the time of interview.  The pt provided permission to discuss medical plan with the family. Opportunity was given to ask question and all questions were answered satisfactorily.   Disposition:  Pt is from home, admitted with left femoral neck fracture, s/p percutaneous pinning, patient has electrolyte imbalance, not a stable to discharge today.   Discharge to SNF, when bed will be available.  Subjective: No significant events overnight, left hip pain is well-controlled, denies any complaints.  Decreased appetite no new other problems.   Physical Exam: General: NAD, lying comfortably Appear in no distress, affect appropriate Eyes: PERRLA ENT: Oral Mucosa Clear, moist  Neck: no JVD,  Cardiovascular: S1 and S2 Present, no Murmur,  Respiratory: good respiratory effort, Bilateral Air entry equal and Decreased, no Crackles, no wheezes Abdomen: Bowel Sound present, Soft and no tenderness,  Skin: no rashes Extremities: no Pedal edema,  no calf tenderness, left hip honeycomb dressing intact Neurologic: without any new focal findings Gait not checked due to patient safety concerns  Vitals:   05/03/23 1609 05/03/23 1926 05/04/23 0335 05/04/23 0813  BP: 128/76 138/78 130/73 (!) 141/79  Pulse:  91 82 70 79  Resp: 16 16 16 15   Temp: (!) 97.5 F (36.4 C) 97.7 F (36.5 C) 98 F (36.7 C) 97.6 F (36.4 C)  TempSrc:   Oral   SpO2: 97% 97% 97% 98%  Weight:      Height:        Intake/Output Summary (Last 24 hours) at 05/04/2023 1551 Last data filed at 05/04/2023 1528 Gross per 24 hour  Intake 629.75 ml  Output --  Net 629.75 ml   Filed Weights   05/01/23 0925 05/02/23 1226  Weight: 70 kg 70 kg    Data Reviewed: I have personally reviewed and interpreted daily labs, tele strips, imagings as discussed above. I reviewed all nursing notes, pharmacy notes, vitals, pertinent old records I have discussed plan of care as described above with RN and patient/family.  CBC: Recent Labs  Lab 05/01/23 1002 05/02/23 0950 05/03/23 0418 05/04/23 0454  WBC 9.8 9.4 6.7 6.8  NEUTROABS 8.8*  --   --   --   HGB 12.5 13.4 11.3* 10.9*  HCT 37.8 40.2 34.4* 33.2*  MCV 88.7 88.0 88.2 87.8  PLT 158 169 140* 171   Basic Metabolic Panel: Recent Labs  Lab 05/01/23 1002 05/02/23 0950 05/03/23 0418 05/04/23 0454 05/04/23 1014  NA 137 137 134* 134* 135  K 3.3* 3.3* 2.9* 6.0* 5.4*  CL 106 106 101 101 104  CO2 25 24 25 24 23   GLUCOSE 118* 117* 107* 81 111*  BUN 16 17 18 22 23   CREATININE 1.46* 1.22* 1.11* 1.30* 1.52*  CALCIUM  9.6 9.8 9.2 9.3 9.6  MG  --   --  1.7 1.7  --   PHOS  --   --  2.6 1.8*  --     Studies: No results found.  Scheduled Meds:  acetaminophen   1,000 mg Oral Q8H   amLODipine   10 mg Oral Daily   atorvastatin   40 mg Oral Daily   benazepril   20 mg Oral Daily   cyanocobalamin  1,000 mcg Intramuscular Q1200   Followed by   Cecily Cohen ON 05/11/2023] vitamin B-12  1,000 mcg Oral Daily   docusate sodium   100 mg Oral BID   enoxaparin  (LOVENOX ) injection  40 mg Subcutaneous Q24H   famotidine   20 mg Oral Daily   feeding supplement  237 mL Oral BID BM   multivitamin with minerals  1 tablet Oral Daily   Continuous Infusions:  sodium PHOSPHATE  IVPB (in mmol) 30 mmol  (05/04/23 1213)   PRN Meds: bisacodyl , HYDROmorphone  (DILAUDID ) injection, loratadine , methocarbamol  **OR** methocarbamol  (ROBAXIN ) injection, metoCLOPramide  **OR** metoCLOPramide  (REGLAN ) injection, ondansetron  **OR** ondansetron  (ZOFRAN ) IV, oxyCODONE , oxyCODONE , senna-docusate, sodium phosphate   Time spent: 55 minutes  Author: Althia Atlas. MD [facility] 05/04/2023 3:51 PM  To reach On-call, see care teams to locate the attending and reach out to them via [email or web address]. If 7PM-7AM, please contact night-coverage If you still have difficulty reaching the attending provider, please page the [facility] (Director on Call) for [facility] on amion for assistance.

## 2023-05-04 NOTE — Progress Notes (Signed)
   05/04/23 1200  Spiritual Encounters  Type of Visit Follow up  Care provided to: Pt and family  Reason for visit Advance directives  OnCall Visit Yes   Advance Directives completed and notarized by Gwyndolyn Lerner. Documents uploaded to [email or web address], one hard copy placed in binder and original plus two copies given to patient.

## 2023-05-04 NOTE — Plan of Care (Signed)
   Problem: Clinical Measurements: Goal: Ability to maintain clinical measurements within normal limits will improve Outcome: Progressing   Problem: Activity: Goal: Risk for activity intolerance will decrease Outcome: Progressing

## 2023-05-04 NOTE — Progress Notes (Signed)
   05/04/23 0900  Spiritual Encounters  Type of Visit Initial  Care provided to: Pt and family  Referral source Patient request  Reason for visit Advance directives  OnCall Visit Yes   Chaplain responded to provide AD paperwork to family. Chaplain services available if/when needed to provide notary and witnessed upon request.

## 2023-05-04 NOTE — Care Management Important Message (Signed)
 Important Message  Patient Details  Name: Robin Roberts MRN: [id number] Date of Birth: Sep 08, 1933   Important Message Given:  Yes - Medicare IM     Anise Kerns 05/04/2023, 1:05 PM

## 2023-05-05 DIAGNOSIS — S72002A Fracture of unspecified part of neck of left femur, initial encounter for closed fracture: Secondary | ICD-10-CM | POA: Diagnosis not present

## 2023-05-05 LAB — CBC
HCT: 32.7 % — ABNORMAL LOW (ref 36.0–46.0)
Hemoglobin: 10.6 g/dL — ABNORMAL LOW (ref 12.0–15.0)
MCH: 28.5 pg (ref 26.0–34.0)
MCHC: 32.4 g/dL (ref 30.0–36.0)
MCV: 87.9 fL (ref 80.0–100.0)
Platelets: 184 10*3/uL (ref 150–400)
RBC: 3.72 MIL/uL — ABNORMAL LOW (ref 3.87–5.11)
RDW: 14.8 % (ref 11.5–15.5)
WBC: 4.8 10*3/uL (ref 4.0–10.5)
nRBC: 0 % (ref 0.0–0.2)

## 2023-05-05 LAB — BASIC METABOLIC PANEL WITH GFR
Anion gap: 9 (ref 5–15)
BUN: 26 mg/dL — ABNORMAL HIGH (ref 8–23)
CO2: 24 mmol/L (ref 22–32)
Calcium: 9.3 mg/dL (ref 8.9–10.3)
Chloride: 104 mmol/L (ref 98–111)
Creatinine, Ser: 1.37 mg/dL — ABNORMAL HIGH (ref 0.44–1.00)
GFR, Estimated: 37 mL/min — ABNORMAL LOW (ref >60)
Glucose, Bld: 90 mg/dL (ref 70–99)
Potassium: 4.2 mmol/L (ref 3.5–5.1)
Sodium: 137 mmol/L (ref 135–145)

## 2023-05-05 LAB — URINE CULTURE: Culture: 20000 — AB

## 2023-05-05 MED ORDER — CYANOCOBALAMIN 1000 MCG PO TABS: 1000.0000 ug | ORAL_TABLET | Freq: Every day | ORAL | 0 refills | Status: AC

## 2023-05-05 MED ORDER — CIPROFLOXACIN HCL 250 MG PO TABS
250.0000 mg | ORAL_TABLET | Freq: Two times a day (BID) | ORAL | 0 refills | Status: DC
Start: 2023-05-05 — End: 2023-05-05

## 2023-05-05 MED ORDER — CYANOCOBALAMIN 1000 MCG PO TABS: 1000.0000 ug | ORAL_TABLET | Freq: Every day | ORAL | 0 refills | Status: DC

## 2023-05-05 MED ORDER — ACETAMINOPHEN 325 MG PO TABS: 650.0000 mg | ORAL_TABLET | Freq: Three times a day (TID) | ORAL | Status: AC | PRN

## 2023-05-05 MED ORDER — CIPROFLOXACIN HCL 250 MG PO TABS: 250.0000 mg | ORAL_TABLET | Freq: Two times a day (BID) | ORAL | 0 refills | Status: AC

## 2023-05-05 NOTE — Progress Notes (Addendum)
 1249 Daughter Loyce Ruffini educated on proper Lovonox injection. Daughter verbalized understanding.    1251 D/C AVS completed and reviewed with pt. All opportunities for questions answered and clarified. IV removed. Pt will be wheeled down to car at medical mall entrance via wheelchair.  D/c barriers awaiting BSC to be delivered to room.   1640 BSC delivered. Pt ready for d/c

## 2023-05-05 NOTE — Progress Notes (Signed)
 Physical Therapy Treatment Patient Details Name: Robin Roberts MRN: [id number] DOB: 12/14/1933 Today's Date: 05/05/2023   History of Present Illness Robin Roberts is a 89 y.o. female with medical history significant of HTN, CKD stage IIIb,, breast cancer, presented with fall and hip fracture. Patient is s/p hip pinning.    PT Comments  OOB and completes gait, stair training and education for DC home with daughter.  Daughter feels comfortable with discharge home.  Recommendations updated, gait belt given and communicated with team as family is comfortable with discharge home today if appropriate.   If plan is discharge home, recommend the following: A little help with walking and/or transfers;A little help with bathing/dressing/bathroom;Assistance with cooking/housework;Direct supervision/assist for financial management;Direct supervision/assist for medications management;Help with stairs or ramp for entrance   Can travel by private vehicle        Equipment Recommendations       Recommendations for Other Services       Precautions / Restrictions Precautions Precautions: Fall Recall of Precautions/Restrictions: Intact Restrictions Weight Bearing Restrictions Per Provider Order: Yes LLE Weight Bearing Per Provider Order: Weight bearing as tolerated     Mobility  Bed Mobility Overal bed mobility: Needs Assistance Bed Mobility: Supine to Sit, Sit to Supine     Supine to sit: Contact guard, Supervision, HOB elevated, Used rails Sit to supine: Supervision, Contact guard assist, HOB elevated, Used rails     Patient Response: Cooperative  Transfers Overall transfer level: Needs assistance Equipment used: Rolling walker (2 wheels) Transfers: Sit to/from Stand Sit to Stand: Min assist                Ambulation/Gait Ambulation/Gait assistance: Contact guard assist Gait Distance (Feet): 300 Feet Assistive device: Rolling walker (2 wheels) Gait Pattern/deviations:  Step-through pattern, Decreased step length - right, Decreased step length - left, Decreased stride length, Narrow base of support Gait velocity: decr     General Gait Details: Daughter present for gait training, educated on safety awareness, use of gait belt   Stairs Stairs: Yes Stairs assistance: Contact guard assist Stair Management: One rail Left, Two rails, Step to pattern, Forwards Number of Stairs: 4 General stair comments: does well with cues   Wheelchair Mobility     Tilt Bed Tilt Bed Patient Response: Cooperative  Modified Rankin (Stroke Patients Only)       Balance Overall balance assessment: Needs assistance Sitting-balance support: Feet supported Sitting balance-Leahy Scale: Good     Standing balance support: Bilateral upper extremity supported, During functional activity, Reliant on assistive device for balance Standing balance-Leahy Scale: Fair Standing balance comment: CGA for gait with RW                            Communication Communication Communication: Impaired Factors Affecting Communication: Hearing impaired  Cognition Arousal: Alert Behavior During Therapy: WFL for tasks assessed/performed   PT - Cognitive impairments: Memory, Problem solving, Safety/Judgement, Awareness                       PT - Cognition Comments: Cognition improving        Cueing Cueing Techniques: Verbal cues  Exercises Other Exercises Other Exercises: to/from bathroom x 2 during session for feeling of BM but no reaults    General Comments        Pertinent Vitals/Pain Pain Assessment Pain Assessment: Faces Faces Pain Scale: Hurts a little bit Pain Location: L hip/leg Pain  Descriptors / Indicators: Discomfort, Sore Pain Intervention(s): Limited activity within patient's tolerance, Monitored during session, Premedicated before session, Repositioned    Home Living                          Prior Function            PT  Goals (current goals can now be found in the care plan section) Progress towards PT goals: Progressing toward goals    Frequency    BID      PT Plan      Co-evaluation              AM-PAC PT "6 Clicks" Mobility   Outcome Measure  Help needed turning from your back to your side while in a flat bed without using bedrails?: None Help needed moving from lying on your back to sitting on the side of a flat bed without using bedrails?: A Little Help needed moving to and from a bed to a chair (including a wheelchair)?: A Little Help needed standing up from a chair using your arms (e.g., wheelchair or bedside chair)?: A Little Help needed to walk in hospital room?: A Little Help needed climbing 3-5 steps with a railing? : A Little 6 Click Score: 19    End of Session Equipment Utilized During Treatment: Gait belt Activity Tolerance: Patient tolerated treatment well Patient left: in bed;with call bell/phone within reach;with bed alarm set;with family/visitor present Nurse Communication: Mobility status;Patient requests pain meds PT Visit Diagnosis: Unsteadiness on feet (R26.81);Other abnormalities of gait and mobility (R26.89);Muscle weakness (generalized) (M62.81);Difficulty in walking, not elsewhere classified (R26.2);Pain;History of falling (Z91.81) Pain - Right/Left: Left     Time: 2956-2130 PT Time Calculation (min) (ACUTE ONLY): 30 min  Charges:    $Gait Training: 8-22 mins PT General Charges $$ ACUTE PT VISIT: 1 Visit                   Charlanne Cong, PTA 05/05/23, 10:38 AM

## 2023-05-05 NOTE — TOC Initial Note (Signed)
 Transition of Care [facility]) - Initial/Assessment Note    Patient Details  Name: Robin Roberts MRN: [id number] Date of Birth: 09-18-1933  [facility] of [facility]) CM/SW Contact:    Alexandra Ice, RN Phone Number: 05/05/2023, 10:54 AM  Clinical Narrative:                 Robin Roberts, daughter. Explained purpose of call from [facility]. She stated patient will be discharging to her house, confirmed her address is correct in demographics. She stated she has purchased DME over the years, has rollator, walker, shower chair, BSC without bucket. TOC will order [facility] for patient. She will need home health set up, she has no preference. TOC contacted [facility], awaiting call back. Sent referral to [employer] for [facility] for processing.   Expected Discharge Plan: Home w Home Health Services     Patient Goals and CMS Choice     Choice offered to / list presented to : Patient, Adult Children      Expected Discharge Plan and Services   Discharge Planning Services: CM Consult Post Acute Care Choice: Durable Medical Equipment, Home Health Living arrangements for the past 2 months: Apartment                 DME Arranged: Bedside commode DME Agency: [employer] Date DME Agency Contacted: 05/05/23 Time DME Agency Contacted: 1054 Representative spoke with at DME Agency: [facility] Arranged: RN, PT, OT          Prior Living Arrangements/Services Living arrangements for the past 2 months: Apartment Lives with:: Self Patient language and need for interpreter reviewed:: Yes Do you feel safe going back to the place where you live?: Yes      Need for Family Participation in Patient Care: Yes (Comment) Care giver support system in place?: Yes (comment) Current home services: DME (Rollator, RW, Environmental consultant, Shower chair, ETS,) Criminal Activity/Legal Involvement Pertinent to Current Situation/Hospitalization: No - Comment as needed  Activities of Daily Living   ADL Screening (condition at time of  admission) Independently performs ADLs?: No Does the patient have a NEW difficulty with bathing/dressing/toileting/self-feeding that is expected to last >3 days?: Yes (Initiates electronic notice to provider for possible OT consult) Does the patient have a NEW difficulty with getting in/out of bed, walking, or climbing stairs that is expected to last >3 days?: Yes (Initiates electronic notice to provider for possible PT consult) Does the patient have a NEW difficulty with communication that is expected to last >3 days?: No Is the patient deaf or have difficulty hearing?: No Does the patient have difficulty seeing, even when wearing glasses/contacts?: No Does the patient have difficulty concentrating, remembering, or making decisions?: No  Permission Sought/Granted                  Emotional Assessment Appearance:: Appears stated age     Orientation: : Oriented to Self, Oriented to Place, Oriented to  Time, Oriented to Situation   Psych Involvement: No (comment)  Admission diagnosis:  Hip fracture (HCC) [S72.009A] Fall, initial encounter [W19.XXXA] Closed displaced fracture of left femoral neck (HCC) [S72.002A] Patient Active Problem List   Diagnosis Date Noted   Protein-calorie malnutrition, severe 05/02/2023   Hypertension 05/02/2023   CKD stage 3b, GFR 30-44 ml/min (HCC) 05/02/2023   Femoral neck fracture (HCC) 05/01/2023   Hip fracture (HCC) 05/01/2023   PCP:  Lyle San, MD Pharmacy:   [facility] [phone] - [place], [place] - [address] AT [facility]  OF [address] [place] [address] Phone: [phone] Fax: [phone]     Social Drivers of Health (SDOH) Social History: SDOH Screenings   Food Insecurity: No Food Insecurity (05/01/2023)  Housing: Low Risk  (05/01/2023)  Transportation Needs: No Transportation Needs (05/01/2023)  Utilities: Not At Risk (05/01/2023)  Financial Resource Strain: Patient Declined (02/13/2023)    Received from [facility]  Social Connections: Socially Isolated (05/01/2023)  Tobacco Use: Low Risk  (05/02/2023)   SDOH Interventions:     Readmission Risk Interventions     No data to display

## 2023-05-05 NOTE — TOC Progression Note (Signed)
 Transition of Care [facility]) - Progression Note    Patient Details  Name: Robin Roberts MRN: [id number] Date of Birth: 03-18-1933  Transition of Care [facility]) CM/SW Contact  Alexandra Ice, RN Phone Number: 05/05/2023, 11:40 AM  Clinical Narrative:     Faxed H&P, discharge orders, and facesheet to [facility].   Expected Discharge Plan: Home w Home Health Services Barriers to Discharge: Barriers Resolved  Expected Discharge Plan and Services   Discharge Planning Services: CM Consult Post Acute Care Choice: Durable Medical Equipment, Home Health Living arrangements for the past 2 months: Apartment Expected Discharge Date: 05/05/23               DME Arranged: Bedside commode DME Agency: [employer], NA Date DME Agency Contacted: 05/05/23 Time DME Agency Contacted: 1132 Representative spoke with at DME Agency: [facility] HH Arranged: PT HH Agency: Other - See comment ([facility]) Date HH Agency Contacted: 05/05/23 Time HH Agency Contacted: 1133 Representative spoke with at [facility] Agency: Daisy   Social Determinants of Health (SDOH) Interventions SDOH Screenings   Food Insecurity: No Food Insecurity (05/01/2023)  Housing: Low Risk  (05/01/2023)  Transportation Needs: No Transportation Needs (05/01/2023)  Utilities: Not At Risk (05/01/2023)  Financial Resource Strain: Patient Declined (02/13/2023)   Received from [facility]  Social Connections: Socially Isolated (05/01/2023)  Tobacco Use: Low Risk  (05/02/2023)    Readmission Risk Interventions     No data to display

## 2023-05-05 NOTE — Discharge Summary (Signed)
 [facility] Discharge Summary   Patient: Robin Roberts GNF:[id number]  PCP: Lyle San, MD  Date of admission: 05/01/2023   Date of discharge:  05/05/2023     Discharge Diagnoses:  Principal Problem:   Hip fracture [facility]) Active Problems:   Femoral neck fracture (HCC)   Protein-calorie malnutrition, severe   Hypertension   CKD stage 3b, GFR 30-44 ml/min (HCC)   Admitted From: Home Disposition:  Home with [facility] PT/OT  Recommendations for Outpatient Follow-up:  PCP: in 1 wk F/u Otho in  1 wk Follow up LABS/TEST:     Follow-up Information     Bert Britain, PA-C Follow up in 2 week(s).   Specialty: Orthopedic Surgery Why: For staple removal and x-rays of the left hip Contact information: [address] [place] [address] [phone]         Lyle San, MD Follow up in 1 week(s).   Specialty: Family Medicine Contact information: [address] [facility] [place] [address] [phone]                Diet recommendation: Cardiac diet  Activity: The patient is advised to gradually reintroduce usual activities, as tolerated  Discharge Condition: stable  Code Status: Full code   History of present illness: As per the H and P dictated on admission.  Hospital Course:  Robin Roberts is a 89 y.o. female with medical history significant of HTN, CKD stage IIIb,, breast cancer, presented with fall and hip fracture.   At baseline patient has a chronic ambulation impairment using rolling walker to ambulate.  Yesterday morning, patient woke up, when she tried to stand up and walk, she lost her balance and fell to the left side and hit her hip.  Yesterday, patient still able to ambulate with her walker with increasing left hip pain.  Denied any LOC, no head or neck injuries, no chest pain shortness of breath.  At baseline patient is not very active because of her ambulation status, but denies any shortness of breath or  chest pain with ambulate, she lives by herself and able to complete all of her daily activities without difficulties.     Assessment and Plan:   # Left subcapital femoral neck fracture # Mechanical fall Neurovascularly intact. Vit d wnl. No anemia thus far. S/p bowel regimen, and pain control outpt osteoporosis PCP f/u Orthopedic consulted, s/p percutaneous pinning of left femoral neck fracture. WBAT on the operative extremity. Lovenox  40mg /day x 4 weeks.  Patient was cleared by orthopedic surgery, prescription of Lovenox  and oxycodone  was given by orthopedic surgery.   # Hypokalemia, potassium repleted.  5/3 developed hyperkalemia, could be due to lab error reported on 5/2 and excessive potassium replacement was given. Potassium 5.4, Lokelma 10 g one-time dose given Hyperkalemia resolved.  # Hypophosphatemia, Phos repleted.  Resolved # Urinary frequency UA: Nitrate positive, LE trace, bacteria rare, WBC 21-50 -Urine culture was not sent 5/2 urine culture grew 20K Citrobacter, pansensitive. Patient received cefazolin  2 g every 6 hourly x 3 doses for surgical prophylaxis. Patient still complaining of urinary frequency, discharged on ciprofloxacin 250 mg p.o. twice daily for 3 days   # HTN: continue home amlodipine , benazapril, statin # CKD 3b: Kidney function is at baseline, Encourage fluids # Vitamin B12 level 317, goal >400.  Started vitamin B12 1000 mcg IM injection during hospital stay followed by oral supplement.  Follow with PCP to repeat B12 level after 3 to 6 months.  Body mass index is 24.17 kg/m.  Nutrition Problem: Severe Malnutrition Etiology: social / environmental circumstances Nutrition Interventions: Interventions: Ensure Enlive (each supplement provides 350kcal and 20 grams of protein), MVI, Liberalize Diet   Pain control  Oxycodone  prescription was given by orthopedic surgery. - Patient was instructed, not to drive, operate heavy machinery, perform  activities at heights, swimming or participation in water activities or provide baby sitting services while on Pain, Sleep and Anxiety Medications; until her outpatient Physician has advised to do so again.  - Also recommended to not to take more than prescribed Pain, Sleep and Anxiety Medications.  Patient was seen by physical therapy, who recommended Home health, which was arranged. On the day of the discharge the patient's vitals were stable, and no other acute medical condition were reported by patient. the patient was felt safe to be discharge at Home with Home health.  Consultants: Orthopedic surgery Procedures: s/p percutaneous pinning of left femoral neck fracture.  Discharge Exam: General: Appear in no distress, no Rash; Oral Mucosa Clear, moist. Cardiovascular: S1 and S2 Present, no Murmur, Respiratory: normal respiratory effort, Bilateral Air entry present and no Crackles, no wheezes Abdomen: Bowel Sound present, Soft and no tenderness, no hernia Extremities: no Pedal edema, no calf tenderness, left hip incision clean, dry, no drainage Neurology: alert and oriented to time, place, and person affect appropriate.  Filed Weights   05/01/23 0925 05/02/23 1226  Weight: 70 kg 70 kg   Vitals:   05/05/23 0326 05/05/23 0719  BP: 137/87 (!) 121/91  Pulse: 93 99  Resp: 16 16  Temp: 97.6 F (36.4 C) 97.8 F (36.6 C)  SpO2: 98% 98%    DISCHARGE MEDICATION: Allergies as of 05/05/2023       Reactions   Penicillins Other (See Comments)   "Throat closed"        Medication List     STOP taking these medications    fluticasone 50 MCG/ACT nasal spray Commonly known as: FLONASE   hydrochlorothiazide 12.5 MG capsule Commonly known as: MICROZIDE   hydrocortisone  2.5 % cream   ketorolac  0.5 % ophthalmic solution Commonly known as: Acular    mirtazapine 7.5 MG tablet Commonly known as: REMERON   triamterene-hydrochlorothiazide 37.5-25 MG tablet Commonly known as:  MAXZIDE-25       TAKE these medications    acetaminophen  325 MG tablet Commonly known as: TYLENOL  Take 2 tablets (650 mg total) by mouth every 8 (eight) hours as needed for mild pain (pain score 1-3), moderate pain (pain score 4-6), fever or headache.   amLODipine  10 MG tablet Commonly known as: NORVASC  Take 10 mg by mouth daily.   atorvastatin  80 MG tablet Commonly known as: LIPITOR  Take 80 mg by mouth daily.   benazepril  20 MG tablet Commonly known as: LOTENSIN  Take 20 mg by mouth daily.   ciprofloxacin 250 MG tablet Commonly known as: Cipro Take 1 tablet (250 mg total) by mouth 2 (two) times daily for 3 days.   cyanocobalamin 1000 MCG tablet Take 1 tablet (1,000 mcg total) by mouth daily. Start taking on: May 11, 2023   enoxaparin  40 MG/0.4ML injection Commonly known as: LOVENOX  Inject 0.4 mLs (40 mg total) into the skin daily for 28 days.   famotidine  20 MG tablet Commonly known as: PEPCID  Take 20 mg by mouth daily.   loratadine  10 MG tablet Commonly known as: CLARITIN  Take 10 mg by mouth daily as needed for allergies.   mupirocin  ointment 2 % Commonly known as: BACTROBAN  Apply  with each band-aid change.   oxyCODONE  5 MG immediate release tablet Commonly known as: Oxy IR/ROXICODONE  Take 0.5-1 tablets (2.5-5 mg total) by mouth every 4 (four) hours as needed for moderate pain (pain score 4-6) (pain score 4-6).   Probiotic (Lactobacillus) Caps Take by mouth.               Durable Medical Equipment  (From admission, onward)           Start     Ordered   05/05/23 1050  For home use only DME Bedside commode  Once       Question:  Patient needs a bedside commode to treat with the following condition  Answer:  Fracture of hip (HCC)   05/05/23 1049   05/05/23 0000  For home use only DME 3 n 1        05/05/23 1100           Allergies  Allergen Reactions   Penicillins Other (See Comments)    "Throat closed"   Discharge Instructions      Call MD for:  difficulty breathing, headache or visual disturbances   Complete by: As directed    Call MD for:  extreme fatigue   Complete by: As directed    Call MD for:  persistant dizziness or light-headedness   Complete by: As directed    Call MD for:  redness, tenderness, or signs of infection (pain, swelling, redness, odor or green/yellow discharge around incision site)   Complete by: As directed    Call MD for:  severe uncontrolled pain   Complete by: As directed    Call MD for:  temperature >100.4   Complete by: As directed    Diet - low sodium heart healthy   Complete by: As directed    Discharge instructions   Complete by: As directed    Follow-up with PCP in 1 week Follow-up with orthopedic surgery in 1 week   For home use only DME 3 n 1   Complete by: As directed    Increase activity slowly   Complete by: As directed        The results of significant diagnostics from this hospitalization (including imaging, microbiology, ancillary and laboratory) are listed below for reference.    Significant Diagnostic Studies: DG HIP UNILAT WITH PELVIS 2-3 VIEWS LEFT Result Date: 05/02/2023 CLINICAL DATA:  Elective surgery. Proximal left femoral fixation. Intraoperative fluoroscopy. EXAM: DG HIP (WITH OR WITHOUT PELVIS) 2-3V LEFT COMPARISON:  Left femur radiographs 05/01/2023 FINDINGS: Images were performed intraoperatively without the presence of a radiologist. Redemonstration of subcapital fracture with medial displacement of the distal fracture component. The patient is undergoing ORIF of this fracture with 3 longitudinal screws extending from the intertrochanteric region through the femoral head. There is persistent inferior displacement of the distal fracture component following fracture fixation. Total fluoroscopy images: 4 Total fluoroscopy time: 64 seconds Total dose: Radiation Exposure Index (as provided by the fluoroscopic device): 7.3 mGy air Kerma Please see intraoperative  findings for further detail. IMPRESSION: Intraoperative fluoroscopy for ORIF of proximal left femoral fracture. Electronically Signed   By: Bertina Broccoli M.D.   On: 05/02/2023 15:26   ECHOCARDIOGRAM COMPLETE Result Date: 05/02/2023    ECHOCARDIOGRAM REPORT   Patient Name:   NICHOLL BURNO Date of Exam: 05/01/2023 Medical Rec #:  [id number]      Height:       67.0 in Accession #:    [id number]     Weight:  154.3 lb Date of Birth:  January 02, 1934      BSA:          1.811 m Patient Age:    89 years       BP:           163/91 mmHg Patient Gender: F              HR:           69 bpm. Exam Location:  [facility] Procedure: 2D Echo, Cardiac Doppler and Color Doppler (Both Spectral and Color            Flow Doppler were utilized during procedure). Indications:     Z01.810 Preoperative cardiovascular examination.  History:         Patient has no prior history of Echocardiogram examinations.                  Risk Factors:Hypertension.  Sonographer:     Brigid Canada RDCS Referring Phys:  [id number] [place] Diagnosing Phys: Belva Boyden MD IMPRESSIONS  1. Left ventricular ejection fraction, by estimation, is 60 to 65%. The left ventricle has normal function. The left ventricle has no regional wall motion abnormalities. There is mild left ventricular hypertrophy. Left ventricular diastolic parameters are consistent with Grade I diastolic dysfunction (impaired relaxation).  2. Right ventricular systolic function is normal. The right ventricular size is normal. There is normal pulmonary artery systolic pressure.  3. The mitral valve is normal in structure. Mild to moderate mitral valve regurgitation. No evidence of mitral stenosis. Moderate mitral annular calcification.  4. Tricuspid valve regurgitation is mild to moderate.  5. The aortic valve is normal in structure. Aortic valve regurgitation is not visualized. Aortic valve sclerosis is present, with no evidence of aortic valve stenosis.  6. The inferior vena cava is  normal in size with greater than 50% respiratory variability, suggesting right atrial pressure of 3 mmHg. FINDINGS  Left Ventricle: Left ventricular ejection fraction, by estimation, is 60 to 65%. The left ventricle has normal function. The left ventricle has no regional wall motion abnormalities. Strain was performed and the global longitudinal strain is indeterminate. The left ventricular internal cavity size was normal in size. There is mild left ventricular hypertrophy. Left ventricular diastolic parameters are consistent with Grade I diastolic dysfunction (impaired relaxation). Right Ventricle: The right ventricular size is normal. No increase in right ventricular wall thickness. Right ventricular systolic function is normal. There is normal pulmonary artery systolic pressure. The tricuspid regurgitant velocity is 2.69 m/s, and  with an assumed right atrial pressure of 5 mmHg, the estimated right ventricular systolic pressure is 33.9 mmHg. Left Atrium: Left atrial size was normal in size. Right Atrium: Right atrial size was normal in size. Pericardium: There is no evidence of pericardial effusion. Mitral Valve: The mitral valve is normal in structure. Moderate mitral annular calcification. Mild to moderate mitral valve regurgitation. No evidence of mitral valve stenosis. Tricuspid Valve: The tricuspid valve is normal in structure. Tricuspid valve regurgitation is mild to moderate. No evidence of tricuspid stenosis. Aortic Valve: The aortic valve is normal in structure. Aortic valve regurgitation is not visualized. Aortic valve sclerosis is present, with no evidence of aortic valve stenosis. Pulmonic Valve: The pulmonic valve was normal in structure. Pulmonic valve regurgitation is mild. No evidence of pulmonic stenosis. Aorta: The aortic root is normal in size and structure. Venous: The inferior vena cava is normal in size with greater than 50% respiratory variability, suggesting right atrial  pressure of 3  mmHg. IAS/Shunts: No atrial level shunt detected by color flow Doppler. Additional Comments: 3D was performed not requiring image post processing on an independent workstation and was indeterminate.  LEFT VENTRICLE PLAX 2D LVIDd:         4.00 cm   Diastology LVIDs:         2.20 cm   LV e' medial:    5.29 cm/s LV PW:         0.90 cm   LV E/e' medial:  10.8 LV IVS:        0.70 cm   LV e' lateral:   7.65 cm/s LVOT diam:     1.70 cm   LV E/e' lateral: 7.5 LV SV:         30 LV SV Index:   16 LVOT Area:     2.27 cm  RIGHT VENTRICLE            IVC RV Basal diam:  2.90 cm    IVC diam: 1.60 cm RV S prime:     8.89 cm/s TAPSE (M-mode): 2.0 cm LEFT ATRIUM             Index        RIGHT ATRIUM          Index LA diam:        3.40 cm 1.88 cm/m   RA Area:     8.35 cm LA Vol (A2C):   18.9 ml 10.43 ml/m  RA Volume:   17.10 ml 9.44 ml/m LA Vol (A4C):   24.0 ml 13.25 ml/m LA Biplane Vol: 23.5 ml 12.97 ml/m  AORTIC VALVE LVOT Vmax:   59.00 cm/s LVOT Vmean:  40.250 cm/s LVOT VTI:    0.132 m  AORTA Ao Root diam: 3.40 cm Ao Asc diam:  3.50 cm MITRAL VALVE               TRICUSPID VALVE MV Area (PHT): 4.02 cm    TR Peak grad:   28.9 mmHg MV Decel Time: 189 msec    TR Vmax:        269.00 cm/s MV E velocity: 57.35 cm/s MV A velocity: 76.45 cm/s  SHUNTS MV E/A ratio:  0.75        Systemic VTI:  0.13 m                            Systemic Diam: 1.70 cm Belva Boyden MD Electronically signed by Belva Boyden MD Signature Date/Time: 05/02/2023/2:10:36 PM    Final    DG C-Arm 1-60 Min-No Report Result Date: 05/02/2023 Fluoroscopy was utilized by the requesting physician.  No radiographic interpretation.   CT Hip Left Wo Contrast Result Date: 05/01/2023 CLINICAL DATA:  Left hip fracture after fall EXAM: CT OF THE LEFT HIP WITHOUT CONTRAST TECHNIQUE: Multidetector CT imaging of the left hip was performed according to the standard protocol. Multiplanar CT image reconstructions were also generated. RADIATION DOSE REDUCTION: This exam was  performed according to the departmental dose-optimization program which includes automated exposure control, adjustment of the mA and/or kV according to patient size and/or use of iterative reconstruction technique. COMPARISON:  Left hip radiographs dated 05/01/2023 at 9:54 a.m. FINDINGS: Bones/Joint/Cartilage Acute mildly impacted subcapital fracture of the left proximal femur with approximately 10 mm of anterior displacement of the posterolateral femoral neck at the level of the posterior femoral head and neck junction. The left femoral head is  seated within the acetabulum. Mild-to-moderate superior joint space narrowing of the left hip. Pubic symphysis is anatomically aligned. Ligaments Ligaments are suboptimally evaluated by CT. Muscles and Tendons Muscles are normal. No muscle atrophy. No intramuscular fluid collection or hematoma. Soft tissue No fluid collection or hematoma. Partially visualized sigmoid colonic diverticulosis. Atherosclerotic vascular calcification. IMPRESSION: Acute displaced and mildly impacted subcapital fracture of the left proximal femur. Electronically Signed   By: Mannie Seek M.D.   On: 05/01/2023 11:37   DG Pelvis 1-2 Views Result Date: 05/01/2023 CLINICAL DATA:  Left hip pain after fall yesterday. EXAM: PELVIS - 1-2 VIEW COMPARISON:  None Available. FINDINGS: Moderately displaced proximal left femoral neck fracture is noted. No other bony abnormality is noted. IMPRESSION: Moderately displaced proximal left femoral neck fracture. Electronically Signed   By: Rosalene Colon M.D.   On: 05/01/2023 10:05   DG Chest Port 1 View Result Date: 05/01/2023 CLINICAL DATA:  Fall. EXAM: PORTABLE CHEST 1 VIEW COMPARISON:  None Available. FINDINGS: The heart size and mediastinal contours are within normal limits. Both lungs are clear. The visualized skeletal structures are unremarkable. IMPRESSION: No active disease. Aortic Atherosclerosis (ICD10-I70.0). Electronically Signed   By: Rosalene Colon M.D.   On: 05/01/2023 10:04   DG FEMUR MIN 2 VIEWS LEFT Result Date: 05/01/2023 CLINICAL DATA:  Left hip pain after fall yesterday. EXAM: LEFT FEMUR 2 VIEWS COMPARISON:  None Available. FINDINGS: Moderately displaced subcapital fracture of proximal left femur is noted. Remaining portion of femur is unremarkable. IMPRESSION: Moderately displaced subcapital proximal left femoral fracture. Electronically Signed   By: Rosalene Colon M.D.   On: 05/01/2023 10:03    Microbiology: Recent Results (from the past 240 hours)  Surgical PCR screen     Status: None   Collection Time: 05/01/23  3:00 PM   Specimen: Nasal Mucosa; Nasal Swab  Result Value Ref Range Status   MRSA, PCR NEGATIVE NEGATIVE Final   Staphylococcus aureus NEGATIVE NEGATIVE Final    Comment: (NOTE) The Xpert SA Assay (FDA approved for NASAL specimens in patients 63 years of age and older), is one component of a comprehensive surveillance program. It is not intended to diagnose infection nor to guide or monitor treatment. Performed at [facility], [address], [place], [place] [address]   Urine Culture (for pregnant, neutropenic or urologic patients or patients with an indwelling urinary catheter)     Status: Abnormal   Collection Time: 05/02/23  6:13 PM   Specimen: Urine, Clean Catch  Result Value Ref Range Status   Specimen Description   Final    URINE, CLEAN CATCH Performed at [facility], [address] [place], [place] [address]    Special Requests   Final    NONE Performed at [facility], [address] [place], [place] [address]    Culture 20,000 COLONIES/mL CITROBACTER FREUNDII (A)  Final   Report Status 05/05/2023 FINAL  Final   Organism ID, Bacteria CITROBACTER FREUNDII (A)  Final      Susceptibility   Citrobacter freundii - MIC*    CEFEPIME <=0.12 SENSITIVE Sensitive     CEFTRIAXONE <=0.25 SENSITIVE Sensitive     CIPROFLOXACIN <=0.25 SENSITIVE Sensitive      GENTAMICIN <=1 SENSITIVE Sensitive     IMIPENEM 0.5 SENSITIVE Sensitive     NITROFURANTOIN <=16 SENSITIVE Sensitive     TRIMETH/SULFA <=20 SENSITIVE Sensitive     PIP/TAZO <=4 SENSITIVE Sensitive ug/mL    * 20,000 COLONIES/mL CITROBACTER  FREUNDII     Labs: CBC: Recent Labs  Lab 05/01/23 1002 05/02/23 0950 05/03/23 0418 05/04/23 0454 05/05/23 0428  WBC 9.8 9.4 6.7 6.8 4.8  NEUTROABS 8.8*  --   --   --   --   HGB 12.5 13.4 11.3* 10.9* 10.6*  HCT 37.8 40.2 34.4* 33.2* 32.7*  MCV 88.7 88.0 88.2 87.8 87.9  PLT 158 169 140* 171 184   Basic Metabolic Panel: Recent Labs  Lab 05/02/23 0950 05/03/23 0418 05/04/23 0454 05/04/23 1014 05/05/23 0428  NA 137 134* 134* 135 137  K 3.3* 2.9* 6.0* 5.4* 4.2  CL 106 101 101 104 104  CO2 24 25 24 23 24   GLUCOSE 117* 107* 81 111* 90  BUN 17 18 22 23  26*  CREATININE 1.22* 1.11* 1.30* 1.52* 1.37*  CALCIUM  9.8 9.2 9.3 9.6 9.3  MG  --  1.7 1.7  --   --   PHOS  --  2.6 1.8*  --   --    Liver Function Tests: No results for input(s): "AST", "ALT", "ALKPHOS", "BILITOT", "PROT", "ALBUMIN" in the last 168 hours. No results for input(s): "LIPASE", "AMYLASE" in the last 168 hours. No results for input(s): "AMMONIA" in the last 168 hours. Cardiac Enzymes: No results for input(s): "CKTOTAL", "CKMB", "CKMBINDEX", "TROPONINI" in the last 168 hours. BNP (last 3 results) No results for input(s): "BNP" in the last 8760 hours. CBG: No results for input(s): "GLUCAP" in the last 168 hours.  Time spent: 35 minutes  Signed:  Althia Atlas  [facility] 05/05/2023 12:58 PM

## 2023-05-05 NOTE — Progress Notes (Signed)
  Subjective: 3 Days Post-Op Procedure(s) (LRB): FIXATION, FEMUR, NECK, PERCUTANEOUS, USING SCREW (Left) Patient reports pain as mild.   Patient is well, and has had no acute complaints or problems.  Plan is to go home after hospital stay.  Discussed this at length with patient and daughter. Negative for chest pain and shortness of breath Fever: no Gastrointestinal: Negative for nausea and vomiting  Objective: Vital signs in last 24 hours: Temp:  [97.6 F (36.4 C)-98.2 F (36.8 C)] 97.8 F (36.6 C) (05/04 0719) Pulse Rate:  [89-99] 99 (05/04 0719) Resp:  [16] 16 (05/04 0719) BP: (121-137)/(73-91) 121/91 (05/04 0719) SpO2:  [97 %-98 %] 98 % (05/04 0719)  Intake/Output from previous day:  Intake/Output Summary (Last 24 hours) at 05/05/2023 1115 Last data filed at 05/05/2023 0954 Gross per 24 hour  Intake 379.75 ml  Output --  Net 379.75 ml    Intake/Output this shift: Total I/O In: 240 [P.O.:240] Out: -   Labs: Recent Labs    05/03/23 0418 05/04/23 0454 05/05/23 0428  HGB 11.3* 10.9* 10.6*   Recent Labs    05/04/23 0454 05/05/23 0428  WBC 6.8 4.8  RBC 3.78* 3.72*  HCT 33.2* 32.7*  PLT 171 184   Recent Labs    05/04/23 1014 05/05/23 0428  NA 135 137  K 5.4* 4.2  CL 104 104  CO2 23 24  BUN 23 26*  CREATININE 1.52* 1.37*  GLUCOSE 111* 90  CALCIUM  9.6 9.3   No results for input(s): "LABPT", "INR" in the last 72 hours.    EXAM General - Patient is alert and mildly confused. Extremity - Sensation intact distally Dorsiflexion/Plantar flexion intact Compartment soft Dressing/Incision - clean, dry, no drainage Motor Function - intact, moving foot and toes well on exam.    Past Medical History:  Diagnosis Date   Actinic keratosis    Basal cell carcinoma    Breast cancer (HCC)    Cancer (HCC)    skin   Cancer (HCC) right   breast cancer 2012   Coronary artery disease    GERD (gastroesophageal reflux disease)    Hypertension    Personal history of  radiation therapy 2012   Squamous cell carcinoma of skin 10/15/2017   Right plantar foot. [facility] SCC   Squamous cell carcinoma of skin 07/11/2022   left 5th DIP, [facility] at [facility] 08/30/22    Assessment/Plan: 3 Days Post-Op Procedure(s) (LRB): FIXATION, FEMUR, NECK, PERCUTANEOUS, USING SCREW (Left) Principal Problem:   Hip fracture (HCC) Active Problems:   Femoral neck fracture (HCC)   Protein-calorie malnutrition, severe   Hypertension   CKD stage 3b, GFR 30-44 ml/min (HCC)  Estimated body mass index is 24.17 kg/m as calculated from the following:   Height as of this encounter: 5\' 7"  (1.702 m).   Weight as of this encounter: 70 kg. Advance diet Up with therapy VSS Labs stable, hemoglobin stable Patient did very well with PT this AM. Recommend discharge home. Stable from Orthopedic standpoint. Order placed for HHPT per [facility] request  Patient will plan to follow up with [facility] in 2 weeks for re-evaluation and staple removal.  DVT Prophylaxis - Lovenox  and TED hose Weight-Bearing as tolerated to left leg  Standley Earing, PA-C Orthopaedic Surgery 05/05/2023, 11:15 AM

## 2023-05-05 NOTE — Progress Notes (Signed)
 Patient is not able to walk the distance required to go the bathroom, or he/she is unable to safely negotiate stairs required to access the bathroom.  A 3in1 BSC will alleviate this problem

## 2023-05-06 DIAGNOSIS — I129 Hypertensive chronic kidney disease with stage 1 through stage 4 chronic kidney disease, or unspecified chronic kidney disease: Secondary | ICD-10-CM | POA: Diagnosis not present

## 2023-05-06 DIAGNOSIS — I2510 Atherosclerotic heart disease of native coronary artery without angina pectoris: Secondary | ICD-10-CM | POA: Diagnosis not present

## 2023-05-06 DIAGNOSIS — E785 Hyperlipidemia, unspecified: Secondary | ICD-10-CM | POA: Diagnosis not present

## 2023-05-06 DIAGNOSIS — S72012D Unspecified intracapsular fracture of left femur, subsequent encounter for closed fracture with routine healing: Secondary | ICD-10-CM | POA: Diagnosis not present

## 2023-05-06 DIAGNOSIS — B019 Varicella without complication: Secondary | ICD-10-CM | POA: Diagnosis not present

## 2023-05-06 DIAGNOSIS — I10 Essential (primary) hypertension: Secondary | ICD-10-CM | POA: Diagnosis not present

## 2023-05-06 DIAGNOSIS — K219 Gastro-esophageal reflux disease without esophagitis: Secondary | ICD-10-CM | POA: Diagnosis not present

## 2023-05-06 DIAGNOSIS — S72002A Fracture of unspecified part of neck of left femur, initial encounter for closed fracture: Secondary | ICD-10-CM | POA: Diagnosis not present

## 2023-05-06 DIAGNOSIS — N1832 Chronic kidney disease, stage 3b: Secondary | ICD-10-CM | POA: Diagnosis not present

## 2023-05-06 DIAGNOSIS — E43 Unspecified severe protein-calorie malnutrition: Secondary | ICD-10-CM | POA: Diagnosis not present

## 2023-05-06 DIAGNOSIS — N390 Urinary tract infection, site not specified: Secondary | ICD-10-CM | POA: Diagnosis not present

## 2023-05-06 NOTE — Anesthesia Postprocedure Evaluation (Signed)
 Anesthesia Post Note  Patient: Robin Roberts  Procedure(s) Performed: FIXATION, FEMUR, NECK, PERCUTANEOUS, USING SCREW (Left: Hip)  Patient location during evaluation: PACU Anesthesia Type: General Level of consciousness: awake and alert Pain management: pain level controlled Vital Signs Assessment: post-procedure vital signs reviewed and stable Respiratory status: spontaneous breathing, nonlabored ventilation, respiratory function stable and patient connected to nasal cannula oxygen Cardiovascular status: blood pressure returned to baseline and stable Postop Assessment: no apparent nausea or vomiting Anesthetic complications: no   No notable events documented.   Last Vitals:  Vitals:   05/05/23 0719 05/05/23 1502  BP: (!) 121/91 131/77  Pulse: 99 (!) 105  Resp: 16 16  Temp: 36.6 C 36.9 C  SpO2: 98% 98%    Last Pain:  Vitals:   05/05/23 0739  TempSrc:   PainSc: 3                  Lattie Poli

## 2023-05-08 DIAGNOSIS — N1832 Chronic kidney disease, stage 3b: Secondary | ICD-10-CM | POA: Diagnosis not present

## 2023-05-08 DIAGNOSIS — E785 Hyperlipidemia, unspecified: Secondary | ICD-10-CM | POA: Diagnosis not present

## 2023-05-08 DIAGNOSIS — S72012D Unspecified intracapsular fracture of left femur, subsequent encounter for closed fracture with routine healing: Secondary | ICD-10-CM | POA: Diagnosis not present

## 2023-05-08 DIAGNOSIS — I129 Hypertensive chronic kidney disease with stage 1 through stage 4 chronic kidney disease, or unspecified chronic kidney disease: Secondary | ICD-10-CM | POA: Diagnosis not present

## 2023-05-08 DIAGNOSIS — E43 Unspecified severe protein-calorie malnutrition: Secondary | ICD-10-CM | POA: Diagnosis not present

## 2023-05-08 DIAGNOSIS — B019 Varicella without complication: Secondary | ICD-10-CM | POA: Diagnosis not present

## 2023-05-08 DIAGNOSIS — I2510 Atherosclerotic heart disease of native coronary artery without angina pectoris: Secondary | ICD-10-CM | POA: Diagnosis not present

## 2023-05-08 DIAGNOSIS — N390 Urinary tract infection, site not specified: Secondary | ICD-10-CM | POA: Diagnosis not present

## 2023-05-08 DIAGNOSIS — K219 Gastro-esophageal reflux disease without esophagitis: Secondary | ICD-10-CM | POA: Diagnosis not present

## 2023-05-09 DIAGNOSIS — N1832 Chronic kidney disease, stage 3b: Secondary | ICD-10-CM | POA: Diagnosis not present

## 2023-05-09 DIAGNOSIS — I2510 Atherosclerotic heart disease of native coronary artery without angina pectoris: Secondary | ICD-10-CM | POA: Diagnosis not present

## 2023-05-09 DIAGNOSIS — E43 Unspecified severe protein-calorie malnutrition: Secondary | ICD-10-CM | POA: Diagnosis not present

## 2023-05-09 DIAGNOSIS — N390 Urinary tract infection, site not specified: Secondary | ICD-10-CM | POA: Diagnosis not present

## 2023-05-09 DIAGNOSIS — E785 Hyperlipidemia, unspecified: Secondary | ICD-10-CM | POA: Diagnosis not present

## 2023-05-09 DIAGNOSIS — I129 Hypertensive chronic kidney disease with stage 1 through stage 4 chronic kidney disease, or unspecified chronic kidney disease: Secondary | ICD-10-CM | POA: Diagnosis not present

## 2023-05-09 DIAGNOSIS — S72012D Unspecified intracapsular fracture of left femur, subsequent encounter for closed fracture with routine healing: Secondary | ICD-10-CM | POA: Diagnosis not present

## 2023-05-09 DIAGNOSIS — K219 Gastro-esophageal reflux disease without esophagitis: Secondary | ICD-10-CM | POA: Diagnosis not present

## 2023-05-09 DIAGNOSIS — R350 Frequency of micturition: Secondary | ICD-10-CM | POA: Diagnosis not present

## 2023-05-09 DIAGNOSIS — B019 Varicella without complication: Secondary | ICD-10-CM | POA: Diagnosis not present

## 2023-05-13 DIAGNOSIS — I129 Hypertensive chronic kidney disease with stage 1 through stage 4 chronic kidney disease, or unspecified chronic kidney disease: Secondary | ICD-10-CM | POA: Diagnosis not present

## 2023-05-13 DIAGNOSIS — I2510 Atherosclerotic heart disease of native coronary artery without angina pectoris: Secondary | ICD-10-CM | POA: Diagnosis not present

## 2023-05-13 DIAGNOSIS — N390 Urinary tract infection, site not specified: Secondary | ICD-10-CM | POA: Diagnosis not present

## 2023-05-13 DIAGNOSIS — B019 Varicella without complication: Secondary | ICD-10-CM | POA: Diagnosis not present

## 2023-05-13 DIAGNOSIS — N1832 Chronic kidney disease, stage 3b: Secondary | ICD-10-CM | POA: Diagnosis not present

## 2023-05-13 DIAGNOSIS — E43 Unspecified severe protein-calorie malnutrition: Secondary | ICD-10-CM | POA: Diagnosis not present

## 2023-05-13 DIAGNOSIS — S72012D Unspecified intracapsular fracture of left femur, subsequent encounter for closed fracture with routine healing: Secondary | ICD-10-CM | POA: Diagnosis not present

## 2023-05-13 DIAGNOSIS — E785 Hyperlipidemia, unspecified: Secondary | ICD-10-CM | POA: Diagnosis not present

## 2023-05-13 DIAGNOSIS — K219 Gastro-esophageal reflux disease without esophagitis: Secondary | ICD-10-CM | POA: Diagnosis not present

## 2023-05-14 DIAGNOSIS — R000 Tachycardia, unspecified: Secondary | ICD-10-CM | POA: Diagnosis not present

## 2023-05-14 DIAGNOSIS — R531 Weakness: Secondary | ICD-10-CM | POA: Diagnosis not present

## 2023-05-14 DIAGNOSIS — Z9181 History of falling: Secondary | ICD-10-CM | POA: Diagnosis not present

## 2023-05-14 DIAGNOSIS — R413 Other amnesia: Secondary | ICD-10-CM | POA: Diagnosis not present

## 2023-05-14 DIAGNOSIS — R270 Ataxia, unspecified: Secondary | ICD-10-CM | POA: Diagnosis not present

## 2023-05-14 DIAGNOSIS — Z87440 Personal history of urinary (tract) infections: Secondary | ICD-10-CM | POA: Diagnosis not present

## 2023-05-14 DIAGNOSIS — R630 Anorexia: Secondary | ICD-10-CM | POA: Diagnosis not present

## 2023-05-14 DIAGNOSIS — H1011 Acute atopic conjunctivitis, right eye: Secondary | ICD-10-CM | POA: Diagnosis not present

## 2023-05-15 DIAGNOSIS — N1832 Chronic kidney disease, stage 3b: Secondary | ICD-10-CM | POA: Diagnosis not present

## 2023-05-15 DIAGNOSIS — B019 Varicella without complication: Secondary | ICD-10-CM | POA: Diagnosis not present

## 2023-05-15 DIAGNOSIS — I2510 Atherosclerotic heart disease of native coronary artery without angina pectoris: Secondary | ICD-10-CM | POA: Diagnosis not present

## 2023-05-15 DIAGNOSIS — N390 Urinary tract infection, site not specified: Secondary | ICD-10-CM | POA: Diagnosis not present

## 2023-05-15 DIAGNOSIS — E785 Hyperlipidemia, unspecified: Secondary | ICD-10-CM | POA: Diagnosis not present

## 2023-05-15 DIAGNOSIS — I129 Hypertensive chronic kidney disease with stage 1 through stage 4 chronic kidney disease, or unspecified chronic kidney disease: Secondary | ICD-10-CM | POA: Diagnosis not present

## 2023-05-15 DIAGNOSIS — S72012D Unspecified intracapsular fracture of left femur, subsequent encounter for closed fracture with routine healing: Secondary | ICD-10-CM | POA: Diagnosis not present

## 2023-05-15 DIAGNOSIS — E43 Unspecified severe protein-calorie malnutrition: Secondary | ICD-10-CM | POA: Diagnosis not present

## 2023-05-15 DIAGNOSIS — K219 Gastro-esophageal reflux disease without esophagitis: Secondary | ICD-10-CM | POA: Diagnosis not present

## 2023-05-16 DIAGNOSIS — Z9889 Other specified postprocedural states: Secondary | ICD-10-CM | POA: Diagnosis not present

## 2023-05-16 DIAGNOSIS — Z8781 Personal history of (healed) traumatic fracture: Secondary | ICD-10-CM | POA: Diagnosis not present

## 2023-05-17 DIAGNOSIS — B019 Varicella without complication: Secondary | ICD-10-CM | POA: Diagnosis not present

## 2023-05-17 DIAGNOSIS — N1832 Chronic kidney disease, stage 3b: Secondary | ICD-10-CM | POA: Diagnosis not present

## 2023-05-17 DIAGNOSIS — N390 Urinary tract infection, site not specified: Secondary | ICD-10-CM | POA: Diagnosis not present

## 2023-05-17 DIAGNOSIS — E43 Unspecified severe protein-calorie malnutrition: Secondary | ICD-10-CM | POA: Diagnosis not present

## 2023-05-17 DIAGNOSIS — I129 Hypertensive chronic kidney disease with stage 1 through stage 4 chronic kidney disease, or unspecified chronic kidney disease: Secondary | ICD-10-CM | POA: Diagnosis not present

## 2023-05-17 DIAGNOSIS — I2510 Atherosclerotic heart disease of native coronary artery without angina pectoris: Secondary | ICD-10-CM | POA: Diagnosis not present

## 2023-05-17 DIAGNOSIS — E785 Hyperlipidemia, unspecified: Secondary | ICD-10-CM | POA: Diagnosis not present

## 2023-05-17 DIAGNOSIS — K219 Gastro-esophageal reflux disease without esophagitis: Secondary | ICD-10-CM | POA: Diagnosis not present

## 2023-05-17 DIAGNOSIS — S72012D Unspecified intracapsular fracture of left femur, subsequent encounter for closed fracture with routine healing: Secondary | ICD-10-CM | POA: Diagnosis not present

## 2023-05-21 DIAGNOSIS — N390 Urinary tract infection, site not specified: Secondary | ICD-10-CM | POA: Diagnosis not present

## 2023-05-21 DIAGNOSIS — I129 Hypertensive chronic kidney disease with stage 1 through stage 4 chronic kidney disease, or unspecified chronic kidney disease: Secondary | ICD-10-CM | POA: Diagnosis not present

## 2023-05-21 DIAGNOSIS — E785 Hyperlipidemia, unspecified: Secondary | ICD-10-CM | POA: Diagnosis not present

## 2023-05-21 DIAGNOSIS — N1832 Chronic kidney disease, stage 3b: Secondary | ICD-10-CM | POA: Diagnosis not present

## 2023-05-21 DIAGNOSIS — I2510 Atherosclerotic heart disease of native coronary artery without angina pectoris: Secondary | ICD-10-CM | POA: Diagnosis not present

## 2023-05-21 DIAGNOSIS — S72012D Unspecified intracapsular fracture of left femur, subsequent encounter for closed fracture with routine healing: Secondary | ICD-10-CM | POA: Diagnosis not present

## 2023-05-21 DIAGNOSIS — E43 Unspecified severe protein-calorie malnutrition: Secondary | ICD-10-CM | POA: Diagnosis not present

## 2023-05-21 DIAGNOSIS — K219 Gastro-esophageal reflux disease without esophagitis: Secondary | ICD-10-CM | POA: Diagnosis not present

## 2023-05-21 DIAGNOSIS — B019 Varicella without complication: Secondary | ICD-10-CM | POA: Diagnosis not present

## 2023-05-22 DIAGNOSIS — N1832 Chronic kidney disease, stage 3b: Secondary | ICD-10-CM | POA: Diagnosis not present

## 2023-05-22 DIAGNOSIS — S72012D Unspecified intracapsular fracture of left femur, subsequent encounter for closed fracture with routine healing: Secondary | ICD-10-CM | POA: Diagnosis not present

## 2023-05-22 DIAGNOSIS — B019 Varicella without complication: Secondary | ICD-10-CM | POA: Diagnosis not present

## 2023-05-22 DIAGNOSIS — E43 Unspecified severe protein-calorie malnutrition: Secondary | ICD-10-CM | POA: Diagnosis not present

## 2023-05-22 DIAGNOSIS — I2510 Atherosclerotic heart disease of native coronary artery without angina pectoris: Secondary | ICD-10-CM | POA: Diagnosis not present

## 2023-05-22 DIAGNOSIS — N390 Urinary tract infection, site not specified: Secondary | ICD-10-CM | POA: Diagnosis not present

## 2023-05-22 DIAGNOSIS — E785 Hyperlipidemia, unspecified: Secondary | ICD-10-CM | POA: Diagnosis not present

## 2023-05-22 DIAGNOSIS — K219 Gastro-esophageal reflux disease without esophagitis: Secondary | ICD-10-CM | POA: Diagnosis not present

## 2023-05-22 DIAGNOSIS — I129 Hypertensive chronic kidney disease with stage 1 through stage 4 chronic kidney disease, or unspecified chronic kidney disease: Secondary | ICD-10-CM | POA: Diagnosis not present

## 2023-05-23 DIAGNOSIS — N1832 Chronic kidney disease, stage 3b: Secondary | ICD-10-CM | POA: Diagnosis not present

## 2023-05-23 DIAGNOSIS — I129 Hypertensive chronic kidney disease with stage 1 through stage 4 chronic kidney disease, or unspecified chronic kidney disease: Secondary | ICD-10-CM | POA: Diagnosis not present

## 2023-05-23 DIAGNOSIS — B019 Varicella without complication: Secondary | ICD-10-CM | POA: Diagnosis not present

## 2023-05-23 DIAGNOSIS — K219 Gastro-esophageal reflux disease without esophagitis: Secondary | ICD-10-CM | POA: Diagnosis not present

## 2023-05-23 DIAGNOSIS — E43 Unspecified severe protein-calorie malnutrition: Secondary | ICD-10-CM | POA: Diagnosis not present

## 2023-05-23 DIAGNOSIS — I2510 Atherosclerotic heart disease of native coronary artery without angina pectoris: Secondary | ICD-10-CM | POA: Diagnosis not present

## 2023-05-23 DIAGNOSIS — E785 Hyperlipidemia, unspecified: Secondary | ICD-10-CM | POA: Diagnosis not present

## 2023-05-23 DIAGNOSIS — N390 Urinary tract infection, site not specified: Secondary | ICD-10-CM | POA: Diagnosis not present

## 2023-05-23 DIAGNOSIS — S72012D Unspecified intracapsular fracture of left femur, subsequent encounter for closed fracture with routine healing: Secondary | ICD-10-CM | POA: Diagnosis not present

## 2023-05-28 DIAGNOSIS — E785 Hyperlipidemia, unspecified: Secondary | ICD-10-CM | POA: Diagnosis not present

## 2023-05-28 DIAGNOSIS — I2510 Atherosclerotic heart disease of native coronary artery without angina pectoris: Secondary | ICD-10-CM | POA: Diagnosis not present

## 2023-05-28 DIAGNOSIS — I129 Hypertensive chronic kidney disease with stage 1 through stage 4 chronic kidney disease, or unspecified chronic kidney disease: Secondary | ICD-10-CM | POA: Diagnosis not present

## 2023-05-28 DIAGNOSIS — K219 Gastro-esophageal reflux disease without esophagitis: Secondary | ICD-10-CM | POA: Diagnosis not present

## 2023-05-28 DIAGNOSIS — E43 Unspecified severe protein-calorie malnutrition: Secondary | ICD-10-CM | POA: Diagnosis not present

## 2023-05-28 DIAGNOSIS — S72012D Unspecified intracapsular fracture of left femur, subsequent encounter for closed fracture with routine healing: Secondary | ICD-10-CM | POA: Diagnosis not present

## 2023-05-28 DIAGNOSIS — N390 Urinary tract infection, site not specified: Secondary | ICD-10-CM | POA: Diagnosis not present

## 2023-05-28 DIAGNOSIS — B019 Varicella without complication: Secondary | ICD-10-CM | POA: Diagnosis not present

## 2023-05-28 DIAGNOSIS — N1832 Chronic kidney disease, stage 3b: Secondary | ICD-10-CM | POA: Diagnosis not present

## 2023-05-30 DIAGNOSIS — K219 Gastro-esophageal reflux disease without esophagitis: Secondary | ICD-10-CM | POA: Diagnosis not present

## 2023-05-30 DIAGNOSIS — S72012D Unspecified intracapsular fracture of left femur, subsequent encounter for closed fracture with routine healing: Secondary | ICD-10-CM | POA: Diagnosis not present

## 2023-05-30 DIAGNOSIS — E43 Unspecified severe protein-calorie malnutrition: Secondary | ICD-10-CM | POA: Diagnosis not present

## 2023-05-30 DIAGNOSIS — I2510 Atherosclerotic heart disease of native coronary artery without angina pectoris: Secondary | ICD-10-CM | POA: Diagnosis not present

## 2023-05-30 DIAGNOSIS — E785 Hyperlipidemia, unspecified: Secondary | ICD-10-CM | POA: Diagnosis not present

## 2023-05-30 DIAGNOSIS — N1832 Chronic kidney disease, stage 3b: Secondary | ICD-10-CM | POA: Diagnosis not present

## 2023-05-30 DIAGNOSIS — B019 Varicella without complication: Secondary | ICD-10-CM | POA: Diagnosis not present

## 2023-05-30 DIAGNOSIS — N390 Urinary tract infection, site not specified: Secondary | ICD-10-CM | POA: Diagnosis not present

## 2023-05-30 DIAGNOSIS — I129 Hypertensive chronic kidney disease with stage 1 through stage 4 chronic kidney disease, or unspecified chronic kidney disease: Secondary | ICD-10-CM | POA: Diagnosis not present

## 2023-05-31 DIAGNOSIS — N390 Urinary tract infection, site not specified: Secondary | ICD-10-CM | POA: Diagnosis not present

## 2023-05-31 DIAGNOSIS — K219 Gastro-esophageal reflux disease without esophagitis: Secondary | ICD-10-CM | POA: Diagnosis not present

## 2023-05-31 DIAGNOSIS — I129 Hypertensive chronic kidney disease with stage 1 through stage 4 chronic kidney disease, or unspecified chronic kidney disease: Secondary | ICD-10-CM | POA: Diagnosis not present

## 2023-05-31 DIAGNOSIS — B019 Varicella without complication: Secondary | ICD-10-CM | POA: Diagnosis not present

## 2023-05-31 DIAGNOSIS — S72012D Unspecified intracapsular fracture of left femur, subsequent encounter for closed fracture with routine healing: Secondary | ICD-10-CM | POA: Diagnosis not present

## 2023-05-31 DIAGNOSIS — I2510 Atherosclerotic heart disease of native coronary artery without angina pectoris: Secondary | ICD-10-CM | POA: Diagnosis not present

## 2023-05-31 DIAGNOSIS — E43 Unspecified severe protein-calorie malnutrition: Secondary | ICD-10-CM | POA: Diagnosis not present

## 2023-05-31 DIAGNOSIS — E785 Hyperlipidemia, unspecified: Secondary | ICD-10-CM | POA: Diagnosis not present

## 2023-05-31 DIAGNOSIS — N1832 Chronic kidney disease, stage 3b: Secondary | ICD-10-CM | POA: Diagnosis not present

## 2023-06-06 DIAGNOSIS — N1832 Chronic kidney disease, stage 3b: Secondary | ICD-10-CM | POA: Diagnosis not present

## 2023-06-06 DIAGNOSIS — E43 Unspecified severe protein-calorie malnutrition: Secondary | ICD-10-CM | POA: Diagnosis not present

## 2023-06-06 DIAGNOSIS — K219 Gastro-esophageal reflux disease without esophagitis: Secondary | ICD-10-CM | POA: Diagnosis not present

## 2023-06-06 DIAGNOSIS — S72012D Unspecified intracapsular fracture of left femur, subsequent encounter for closed fracture with routine healing: Secondary | ICD-10-CM | POA: Diagnosis not present

## 2023-06-06 DIAGNOSIS — B019 Varicella without complication: Secondary | ICD-10-CM | POA: Diagnosis not present

## 2023-06-06 DIAGNOSIS — I2510 Atherosclerotic heart disease of native coronary artery without angina pectoris: Secondary | ICD-10-CM | POA: Diagnosis not present

## 2023-06-06 DIAGNOSIS — E785 Hyperlipidemia, unspecified: Secondary | ICD-10-CM | POA: Diagnosis not present

## 2023-06-06 DIAGNOSIS — I129 Hypertensive chronic kidney disease with stage 1 through stage 4 chronic kidney disease, or unspecified chronic kidney disease: Secondary | ICD-10-CM | POA: Diagnosis not present

## 2023-06-06 DIAGNOSIS — N390 Urinary tract infection, site not specified: Secondary | ICD-10-CM | POA: Diagnosis not present

## 2023-06-11 DIAGNOSIS — I2510 Atherosclerotic heart disease of native coronary artery without angina pectoris: Secondary | ICD-10-CM | POA: Diagnosis not present

## 2023-06-11 DIAGNOSIS — N1832 Chronic kidney disease, stage 3b: Secondary | ICD-10-CM | POA: Diagnosis not present

## 2023-06-11 DIAGNOSIS — E785 Hyperlipidemia, unspecified: Secondary | ICD-10-CM | POA: Diagnosis not present

## 2023-06-11 DIAGNOSIS — K219 Gastro-esophageal reflux disease without esophagitis: Secondary | ICD-10-CM | POA: Diagnosis not present

## 2023-06-11 DIAGNOSIS — E43 Unspecified severe protein-calorie malnutrition: Secondary | ICD-10-CM | POA: Diagnosis not present

## 2023-06-11 DIAGNOSIS — I129 Hypertensive chronic kidney disease with stage 1 through stage 4 chronic kidney disease, or unspecified chronic kidney disease: Secondary | ICD-10-CM | POA: Diagnosis not present

## 2023-06-11 DIAGNOSIS — S72012D Unspecified intracapsular fracture of left femur, subsequent encounter for closed fracture with routine healing: Secondary | ICD-10-CM | POA: Diagnosis not present

## 2023-06-11 DIAGNOSIS — N390 Urinary tract infection, site not specified: Secondary | ICD-10-CM | POA: Diagnosis not present

## 2023-06-11 DIAGNOSIS — B019 Varicella without complication: Secondary | ICD-10-CM | POA: Diagnosis not present

## 2023-06-13 DIAGNOSIS — S72112D Displaced fracture of greater trochanter of left femur, subsequent encounter for closed fracture with routine healing: Secondary | ICD-10-CM | POA: Diagnosis not present

## 2023-06-19 DIAGNOSIS — K219 Gastro-esophageal reflux disease without esophagitis: Secondary | ICD-10-CM | POA: Diagnosis not present

## 2023-06-19 DIAGNOSIS — N390 Urinary tract infection, site not specified: Secondary | ICD-10-CM | POA: Diagnosis not present

## 2023-06-19 DIAGNOSIS — E43 Unspecified severe protein-calorie malnutrition: Secondary | ICD-10-CM | POA: Diagnosis not present

## 2023-06-19 DIAGNOSIS — B019 Varicella without complication: Secondary | ICD-10-CM | POA: Diagnosis not present

## 2023-06-19 DIAGNOSIS — N1832 Chronic kidney disease, stage 3b: Secondary | ICD-10-CM | POA: Diagnosis not present

## 2023-06-19 DIAGNOSIS — I129 Hypertensive chronic kidney disease with stage 1 through stage 4 chronic kidney disease, or unspecified chronic kidney disease: Secondary | ICD-10-CM | POA: Diagnosis not present

## 2023-06-19 DIAGNOSIS — I2510 Atherosclerotic heart disease of native coronary artery without angina pectoris: Secondary | ICD-10-CM | POA: Diagnosis not present

## 2023-06-19 DIAGNOSIS — S72012D Unspecified intracapsular fracture of left femur, subsequent encounter for closed fracture with routine healing: Secondary | ICD-10-CM | POA: Diagnosis not present

## 2023-06-19 DIAGNOSIS — E785 Hyperlipidemia, unspecified: Secondary | ICD-10-CM | POA: Diagnosis not present

## 2023-06-24 DIAGNOSIS — N1832 Chronic kidney disease, stage 3b: Secondary | ICD-10-CM | POA: Diagnosis not present

## 2023-06-24 DIAGNOSIS — H04123 Dry eye syndrome of bilateral lacrimal glands: Secondary | ICD-10-CM | POA: Diagnosis not present

## 2023-06-24 DIAGNOSIS — M6284 Sarcopenia: Secondary | ICD-10-CM | POA: Diagnosis not present

## 2023-06-24 DIAGNOSIS — R54 Age-related physical debility: Secondary | ICD-10-CM | POA: Diagnosis not present

## 2023-06-24 DIAGNOSIS — I129 Hypertensive chronic kidney disease with stage 1 through stage 4 chronic kidney disease, or unspecified chronic kidney disease: Secondary | ICD-10-CM | POA: Diagnosis not present

## 2023-06-24 DIAGNOSIS — R4189 Other symptoms and signs involving cognitive functions and awareness: Secondary | ICD-10-CM | POA: Diagnosis not present

## 2023-06-24 DIAGNOSIS — I10 Essential (primary) hypertension: Secondary | ICD-10-CM | POA: Diagnosis not present

## 2023-06-24 DIAGNOSIS — E785 Hyperlipidemia, unspecified: Secondary | ICD-10-CM | POA: Diagnosis not present

## 2023-06-24 DIAGNOSIS — Z1331 Encounter for screening for depression: Secondary | ICD-10-CM | POA: Diagnosis not present

## 2023-06-24 DIAGNOSIS — R634 Abnormal weight loss: Secondary | ICD-10-CM | POA: Diagnosis not present

## 2023-06-24 DIAGNOSIS — R296 Repeated falls: Secondary | ICD-10-CM | POA: Diagnosis not present

## 2023-06-25 ENCOUNTER — Ambulatory Visit: Payer: Medicare HMO | Admitting: Dermatology

## 2023-06-25 DIAGNOSIS — N1832 Chronic kidney disease, stage 3b: Secondary | ICD-10-CM | POA: Diagnosis not present

## 2023-06-25 DIAGNOSIS — B019 Varicella without complication: Secondary | ICD-10-CM | POA: Diagnosis not present

## 2023-06-25 DIAGNOSIS — I129 Hypertensive chronic kidney disease with stage 1 through stage 4 chronic kidney disease, or unspecified chronic kidney disease: Secondary | ICD-10-CM | POA: Diagnosis not present

## 2023-06-25 DIAGNOSIS — S72012D Unspecified intracapsular fracture of left femur, subsequent encounter for closed fracture with routine healing: Secondary | ICD-10-CM | POA: Diagnosis not present

## 2023-06-25 DIAGNOSIS — E43 Unspecified severe protein-calorie malnutrition: Secondary | ICD-10-CM | POA: Diagnosis not present

## 2023-06-25 DIAGNOSIS — N390 Urinary tract infection, site not specified: Secondary | ICD-10-CM | POA: Diagnosis not present

## 2023-06-25 DIAGNOSIS — K219 Gastro-esophageal reflux disease without esophagitis: Secondary | ICD-10-CM | POA: Diagnosis not present

## 2023-06-25 DIAGNOSIS — I2510 Atherosclerotic heart disease of native coronary artery without angina pectoris: Secondary | ICD-10-CM | POA: Diagnosis not present

## 2023-06-25 DIAGNOSIS — E785 Hyperlipidemia, unspecified: Secondary | ICD-10-CM | POA: Diagnosis not present

## 2023-07-01 DIAGNOSIS — L218 Other seborrheic dermatitis: Secondary | ICD-10-CM | POA: Diagnosis not present

## 2023-07-01 DIAGNOSIS — D485 Neoplasm of uncertain behavior of skin: Secondary | ICD-10-CM | POA: Diagnosis not present

## 2023-07-01 DIAGNOSIS — Z7189 Other specified counseling: Secondary | ICD-10-CM | POA: Diagnosis not present

## 2023-07-01 DIAGNOSIS — L814 Other melanin hyperpigmentation: Secondary | ICD-10-CM | POA: Diagnosis not present

## 2023-07-01 DIAGNOSIS — L570 Actinic keratosis: Secondary | ICD-10-CM | POA: Diagnosis not present

## 2023-07-01 DIAGNOSIS — D225 Melanocytic nevi of trunk: Secondary | ICD-10-CM | POA: Diagnosis not present

## 2023-07-01 DIAGNOSIS — L821 Other seborrheic keratosis: Secondary | ICD-10-CM | POA: Diagnosis not present

## 2023-09-03 DIAGNOSIS — Z7401 Bed confinement status: Secondary | ICD-10-CM | POA: Diagnosis not present

## 2023-09-03 DIAGNOSIS — R451 Restlessness and agitation: Secondary | ICD-10-CM | POA: Diagnosis not present

## 2023-09-03 DIAGNOSIS — R4182 Altered mental status, unspecified: Secondary | ICD-10-CM | POA: Diagnosis not present

## 2023-09-03 DIAGNOSIS — J9622 Acute and chronic respiratory failure with hypercapnia: Secondary | ICD-10-CM | POA: Diagnosis not present

## 2023-09-03 DIAGNOSIS — G2409 Other drug induced dystonia: Secondary | ICD-10-CM | POA: Diagnosis not present

## 2023-09-03 DIAGNOSIS — Z681 Body mass index (BMI) 19 or less, adult: Secondary | ICD-10-CM | POA: Diagnosis not present

## 2023-09-03 DIAGNOSIS — R531 Weakness: Secondary | ICD-10-CM | POA: Diagnosis not present

## 2023-09-03 DIAGNOSIS — Z923 Personal history of irradiation: Secondary | ICD-10-CM | POA: Diagnosis not present

## 2023-09-03 DIAGNOSIS — E440 Moderate protein-calorie malnutrition: Secondary | ICD-10-CM | POA: Diagnosis not present

## 2023-09-03 DIAGNOSIS — I444 Left anterior fascicular block: Secondary | ICD-10-CM | POA: Diagnosis not present

## 2023-09-03 DIAGNOSIS — R404 Transient alteration of awareness: Secondary | ICD-10-CM | POA: Diagnosis not present

## 2023-09-03 DIAGNOSIS — F03911 Unspecified dementia, unspecified severity, with agitation: Secondary | ICD-10-CM | POA: Diagnosis not present

## 2023-09-03 DIAGNOSIS — G9340 Encephalopathy, unspecified: Secondary | ICD-10-CM | POA: Diagnosis not present

## 2023-09-03 DIAGNOSIS — J9621 Acute and chronic respiratory failure with hypoxia: Secondary | ICD-10-CM | POA: Diagnosis not present

## 2023-09-03 DIAGNOSIS — I700 Atherosclerosis of aorta: Secondary | ICD-10-CM | POA: Diagnosis not present

## 2023-09-03 DIAGNOSIS — Z515 Encounter for palliative care: Secondary | ICD-10-CM | POA: Diagnosis not present

## 2023-09-03 DIAGNOSIS — I480 Paroxysmal atrial fibrillation: Secondary | ICD-10-CM | POA: Diagnosis not present

## 2023-09-03 DIAGNOSIS — Z66 Do not resuscitate: Secondary | ICD-10-CM | POA: Diagnosis not present

## 2023-09-03 DIAGNOSIS — F05 Delirium due to known physiological condition: Secondary | ICD-10-CM | POA: Diagnosis not present

## 2023-09-03 DIAGNOSIS — I4891 Unspecified atrial fibrillation: Secondary | ICD-10-CM | POA: Diagnosis not present

## 2023-09-03 DIAGNOSIS — R918 Other nonspecific abnormal finding of lung field: Secondary | ICD-10-CM | POA: Diagnosis not present

## 2023-09-03 DIAGNOSIS — E1165 Type 2 diabetes mellitus with hyperglycemia: Secondary | ICD-10-CM | POA: Diagnosis not present

## 2023-09-03 DIAGNOSIS — R5383 Other fatigue: Secondary | ICD-10-CM | POA: Diagnosis not present

## 2023-09-03 DIAGNOSIS — I517 Cardiomegaly: Secondary | ICD-10-CM | POA: Diagnosis not present

## 2023-09-03 DIAGNOSIS — R000 Tachycardia, unspecified: Secondary | ICD-10-CM | POA: Diagnosis not present

## 2023-09-03 DIAGNOSIS — I890 Lymphedema, not elsewhere classified: Secondary | ICD-10-CM | POA: Diagnosis not present

## 2023-09-03 DIAGNOSIS — I10 Essential (primary) hypertension: Secondary | ICD-10-CM | POA: Diagnosis not present

## 2023-09-03 DIAGNOSIS — F03918 Unspecified dementia, unspecified severity, with other behavioral disturbance: Secondary | ICD-10-CM | POA: Diagnosis not present

## 2023-09-03 DIAGNOSIS — I442 Atrioventricular block, complete: Secondary | ICD-10-CM | POA: Diagnosis not present

## 2023-09-03 DIAGNOSIS — R410 Disorientation, unspecified: Secondary | ICD-10-CM | POA: Diagnosis not present

## 2023-09-04 DIAGNOSIS — I10 Essential (primary) hypertension: Secondary | ICD-10-CM | POA: Diagnosis not present

## 2023-09-04 DIAGNOSIS — I480 Paroxysmal atrial fibrillation: Secondary | ICD-10-CM | POA: Diagnosis not present

## 2023-09-04 DIAGNOSIS — J9622 Acute and chronic respiratory failure with hypercapnia: Secondary | ICD-10-CM | POA: Diagnosis not present

## 2023-09-04 DIAGNOSIS — J9621 Acute and chronic respiratory failure with hypoxia: Secondary | ICD-10-CM | POA: Diagnosis not present

## 2023-09-04 DIAGNOSIS — E1165 Type 2 diabetes mellitus with hyperglycemia: Secondary | ICD-10-CM | POA: Diagnosis not present

## 2023-09-04 DIAGNOSIS — I890 Lymphedema, not elsewhere classified: Secondary | ICD-10-CM | POA: Diagnosis not present

## 2023-09-04 DIAGNOSIS — I442 Atrioventricular block, complete: Secondary | ICD-10-CM | POA: Diagnosis not present

## 2023-09-04 DIAGNOSIS — F03918 Unspecified dementia, unspecified severity, with other behavioral disturbance: Secondary | ICD-10-CM | POA: Diagnosis not present

## 2023-09-05 DIAGNOSIS — R1312 Dysphagia, oropharyngeal phase: Secondary | ICD-10-CM | POA: Diagnosis not present

## 2023-09-05 DIAGNOSIS — K5900 Constipation, unspecified: Secondary | ICD-10-CM | POA: Diagnosis not present

## 2023-09-05 DIAGNOSIS — N1832 Chronic kidney disease, stage 3b: Secondary | ICD-10-CM | POA: Diagnosis not present

## 2023-09-05 DIAGNOSIS — I129 Hypertensive chronic kidney disease with stage 1 through stage 4 chronic kidney disease, or unspecified chronic kidney disease: Secondary | ICD-10-CM | POA: Diagnosis not present

## 2023-09-05 DIAGNOSIS — F05 Delirium due to known physiological condition: Secondary | ICD-10-CM | POA: Diagnosis not present

## 2023-09-05 DIAGNOSIS — S72141D Displaced intertrochanteric fracture of right femur, subsequent encounter for closed fracture with routine healing: Secondary | ICD-10-CM | POA: Diagnosis not present

## 2023-09-05 DIAGNOSIS — E785 Hyperlipidemia, unspecified: Secondary | ICD-10-CM | POA: Diagnosis not present

## 2023-09-05 DIAGNOSIS — F03918 Unspecified dementia, unspecified severity, with other behavioral disturbance: Secondary | ICD-10-CM | POA: Diagnosis not present

## 2023-09-05 DIAGNOSIS — Z7189 Other specified counseling: Secondary | ICD-10-CM | POA: Diagnosis not present

## 2023-09-05 DIAGNOSIS — E43 Unspecified severe protein-calorie malnutrition: Secondary | ICD-10-CM | POA: Diagnosis not present

## 2023-09-05 DIAGNOSIS — G9340 Encephalopathy, unspecified: Secondary | ICD-10-CM | POA: Diagnosis not present

## 2023-09-05 DIAGNOSIS — E440 Moderate protein-calorie malnutrition: Secondary | ICD-10-CM | POA: Diagnosis not present

## 2023-09-05 DIAGNOSIS — R634 Abnormal weight loss: Secondary | ICD-10-CM | POA: Diagnosis not present

## 2023-09-06 DIAGNOSIS — S72141D Displaced intertrochanteric fracture of right femur, subsequent encounter for closed fracture with routine healing: Secondary | ICD-10-CM | POA: Diagnosis not present

## 2023-09-06 DIAGNOSIS — Z7189 Other specified counseling: Secondary | ICD-10-CM | POA: Diagnosis not present

## 2023-09-06 DIAGNOSIS — R634 Abnormal weight loss: Secondary | ICD-10-CM | POA: Diagnosis not present

## 2023-09-06 DIAGNOSIS — Z515 Encounter for palliative care: Secondary | ICD-10-CM | POA: Diagnosis not present

## 2023-09-06 DIAGNOSIS — E43 Unspecified severe protein-calorie malnutrition: Secondary | ICD-10-CM | POA: Diagnosis not present

## 2023-09-06 DIAGNOSIS — E440 Moderate protein-calorie malnutrition: Secondary | ICD-10-CM | POA: Diagnosis not present

## 2023-09-06 DIAGNOSIS — F05 Delirium due to known physiological condition: Secondary | ICD-10-CM | POA: Diagnosis not present

## 2023-09-06 DIAGNOSIS — F03918 Unspecified dementia, unspecified severity, with other behavioral disturbance: Secondary | ICD-10-CM | POA: Diagnosis not present

## 2023-09-06 DIAGNOSIS — G9340 Encephalopathy, unspecified: Secondary | ICD-10-CM | POA: Diagnosis not present

## 2023-09-06 DIAGNOSIS — I129 Hypertensive chronic kidney disease with stage 1 through stage 4 chronic kidney disease, or unspecified chronic kidney disease: Secondary | ICD-10-CM | POA: Diagnosis not present

## 2023-09-06 DIAGNOSIS — E785 Hyperlipidemia, unspecified: Secondary | ICD-10-CM | POA: Diagnosis not present

## 2023-09-06 DIAGNOSIS — R1312 Dysphagia, oropharyngeal phase: Secondary | ICD-10-CM | POA: Diagnosis not present

## 2023-09-06 DIAGNOSIS — N1832 Chronic kidney disease, stage 3b: Secondary | ICD-10-CM | POA: Diagnosis not present

## 2023-10-02 DEATH — deceased
